# Patient Record
Sex: Male | Born: 1960 | Race: White | Hispanic: No | Marital: Married | Smoking: Never smoker
Health system: Southern US, Community
[De-identification: ages and names within clinical notes are randomized; demographics above are authoritative.]

## PROBLEM LIST (undated history)

## (undated) DIAGNOSIS — F419 Anxiety disorder, unspecified: Secondary | ICD-10-CM

## (undated) DIAGNOSIS — M1990 Unspecified osteoarthritis, unspecified site: Secondary | ICD-10-CM

## (undated) HISTORY — PX: DENTAL SURGERY: SHX609

---

## 2013-02-07 DIAGNOSIS — R7303 Prediabetes: Secondary | ICD-10-CM | POA: Insufficient documentation

## 2013-02-07 DIAGNOSIS — L821 Other seborrheic keratosis: Secondary | ICD-10-CM | POA: Insufficient documentation

## 2013-02-07 DIAGNOSIS — G479 Sleep disorder, unspecified: Secondary | ICD-10-CM | POA: Insufficient documentation

## 2013-02-07 DIAGNOSIS — E785 Hyperlipidemia, unspecified: Secondary | ICD-10-CM | POA: Insufficient documentation

## 2013-02-07 DIAGNOSIS — M25519 Pain in unspecified shoulder: Secondary | ICD-10-CM | POA: Insufficient documentation

## 2013-02-07 DIAGNOSIS — F330 Major depressive disorder, recurrent, mild: Secondary | ICD-10-CM | POA: Insufficient documentation

## 2013-03-08 DIAGNOSIS — Q6602 Congenital talipes equinovarus, left foot: Secondary | ICD-10-CM | POA: Insufficient documentation

## 2013-07-05 DIAGNOSIS — M722 Plantar fascial fibromatosis: Secondary | ICD-10-CM | POA: Insufficient documentation

## 2013-07-05 DIAGNOSIS — F411 Generalized anxiety disorder: Secondary | ICD-10-CM | POA: Insufficient documentation

## 2013-07-05 DIAGNOSIS — M25579 Pain in unspecified ankle and joints of unspecified foot: Secondary | ICD-10-CM | POA: Insufficient documentation

## 2013-12-06 DIAGNOSIS — M19049 Primary osteoarthritis, unspecified hand: Secondary | ICD-10-CM | POA: Insufficient documentation

## 2013-12-06 DIAGNOSIS — M2041 Other hammer toe(s) (acquired), right foot: Secondary | ICD-10-CM | POA: Insufficient documentation

## 2014-04-14 DIAGNOSIS — Z0000 Encounter for general adult medical examination without abnormal findings: Secondary | ICD-10-CM | POA: Insufficient documentation

## 2014-04-14 DIAGNOSIS — Z8042 Family history of malignant neoplasm of prostate: Secondary | ICD-10-CM | POA: Insufficient documentation

## 2015-10-22 DIAGNOSIS — R41840 Attention and concentration deficit: Secondary | ICD-10-CM | POA: Insufficient documentation

## 2016-05-23 DIAGNOSIS — K439 Ventral hernia without obstruction or gangrene: Secondary | ICD-10-CM | POA: Insufficient documentation

## 2017-08-05 DIAGNOSIS — R1031 Right lower quadrant pain: Secondary | ICD-10-CM | POA: Insufficient documentation

## 2018-03-20 ENCOUNTER — Other Ambulatory Visit: Payer: Self-pay

## 2018-03-20 ENCOUNTER — Emergency Department
Admission: EM | Admit: 2018-03-20 | Discharge: 2018-03-20 | Disposition: A | Discharge status: Home / Self Care | Payer: BLUE CROSS/BLUE SHIELD | Attending: Student in an Organized Health Care Education/Training Program | Admitting: Student in an Organized Health Care Education/Training Program

## 2018-03-20 ENCOUNTER — Encounter: Payer: Self-pay | Admitting: Emergency Medicine

## 2018-03-20 ENCOUNTER — Emergency Department: Payer: BLUE CROSS/BLUE SHIELD

## 2018-03-20 DIAGNOSIS — R590 Localized enlarged lymph nodes: Secondary | ICD-10-CM | POA: Insufficient documentation

## 2018-03-20 DIAGNOSIS — J020 Streptococcal pharyngitis: Secondary | ICD-10-CM | POA: Insufficient documentation

## 2018-03-20 DIAGNOSIS — M542 Cervicalgia: Secondary | ICD-10-CM | POA: Diagnosis present

## 2018-03-20 LAB — CBC WITH DIFFERENTIAL/PLATELET
Abs Immature Granulocytes: 0.03 10*3/uL (ref 0.00–0.07)
Basophils Absolute: 0.1 10*3/uL (ref 0.0–0.1)
Basophils Relative: 1 %
Eosinophils Absolute: 0.2 10*3/uL (ref 0.0–0.5)
Eosinophils Relative: 2 %
HCT: 41.6 % (ref 39.0–52.0)
Hemoglobin: 13.7 g/dL (ref 13.0–17.0)
Immature Granulocytes: 0 %
Lymphocytes Relative: 19 %
Lymphs Abs: 1.6 10*3/uL (ref 0.7–4.0)
MCH: 30 pg (ref 26.0–34.0)
MCHC: 32.9 g/dL (ref 30.0–36.0)
MCV: 91.2 fL (ref 80.0–100.0)
MONO ABS: 0.7 10*3/uL (ref 0.1–1.0)
Monocytes Relative: 8 %
Neutro Abs: 5.9 10*3/uL (ref 1.7–7.7)
Neutrophils Relative %: 70 %
Platelets: 315 10*3/uL (ref 150–400)
RBC: 4.56 MIL/uL (ref 4.22–5.81)
RDW: 13.4 % (ref 11.5–15.5)
WBC: 8.4 10*3/uL (ref 4.0–10.5)
nRBC: 0 % (ref 0.0–0.2)

## 2018-03-20 LAB — COMPREHENSIVE METABOLIC PANEL
ALT: 28 U/L (ref 0–44)
AST: 25 U/L (ref 15–41)
Albumin: 4.3 g/dL (ref 3.5–5.0)
Alkaline Phosphatase: 48 U/L (ref 38–126)
Anion gap: 9 (ref 5–15)
BUN: 19 mg/dL (ref 6–20)
CO2: 27 mmol/L (ref 22–32)
CREATININE: 0.9 mg/dL (ref 0.61–1.24)
Calcium: 9.4 mg/dL (ref 8.9–10.3)
Chloride: 100 mmol/L (ref 98–111)
GFR calc Af Amer: >60 mL/min (ref >60)
GFR calc non Af Amer: >60 mL/min (ref >60)
Glucose, Bld: 89 mg/dL (ref 70–99)
Potassium: 4 mmol/L (ref 3.5–5.1)
Sodium: 136 mmol/L (ref 135–145)
Total Bilirubin: 0.3 mg/dL (ref 0.3–1.2)
Total Protein: 8.5 g/dL — ABNORMAL HIGH (ref 6.5–8.1)

## 2018-03-20 LAB — LACTIC ACID, PLASMA: Lactic Acid, Venous: 0.9 mmol/L (ref 0.5–1.9)

## 2018-03-20 LAB — INFLUENZA PANEL BY PCR (TYPE A & B)
Influenza A By PCR: NEGATIVE
Influenza B By PCR: NEGATIVE

## 2018-03-20 LAB — GROUP A STREP BY PCR: Group A Strep by PCR: DETECTED — AB

## 2018-03-20 IMAGING — CT CT NECK W/ CM
4 of 5 series · 15 of 33 positions shown, 17 images · IV contrast (omnipaque)
Comparison: None.

CLINICAL DATA: Solitary neck mass

EXAM:
CT NECK WITH CONTRAST
TECHNIQUE: Multidetector CT imaging of the neck was performed using the
standard protocol following the bolus administration of intravenous
contrast.
CONTRAST:  75mL OMNIPAQUE IOHEXOL 300 MG/ML  SOLN

[Series 2: axial neck · axial · 0.54mm/px · z∈[-333,-185]mm · 4 of 124 slices shown, 5 images]
[im 25/124  soft-tissue]
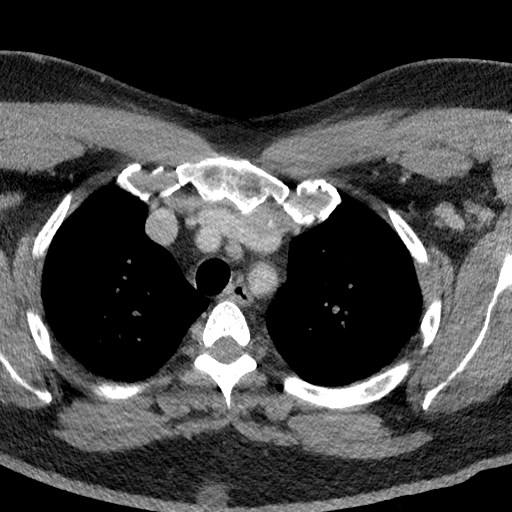
[im 25/124  bone]
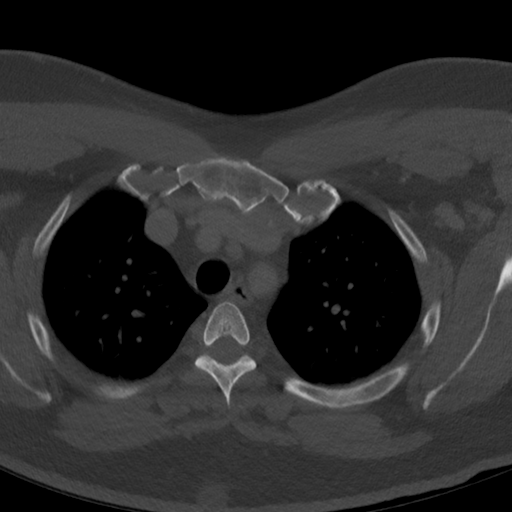
[im 50/124  bone]
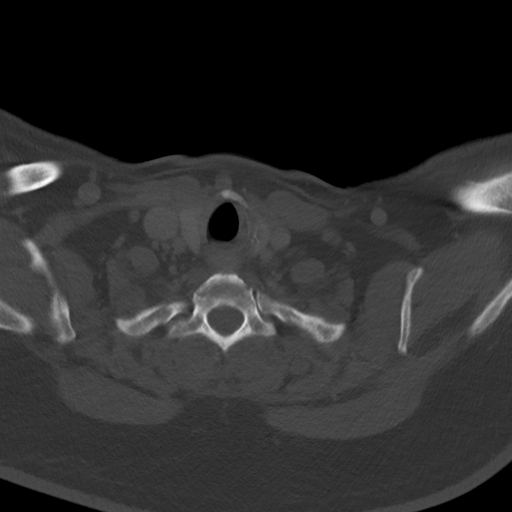
[im 74/124  bone]
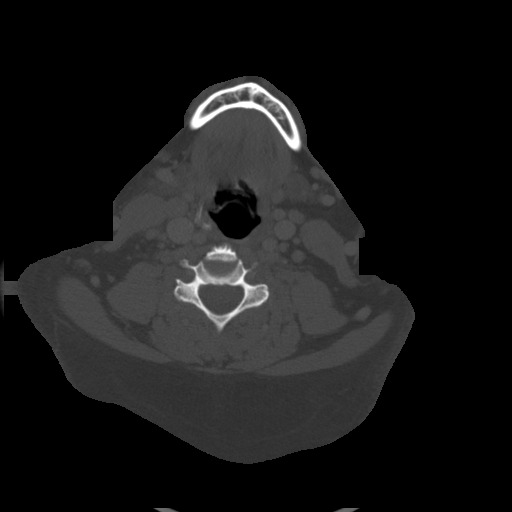
[im 99/124  bone]
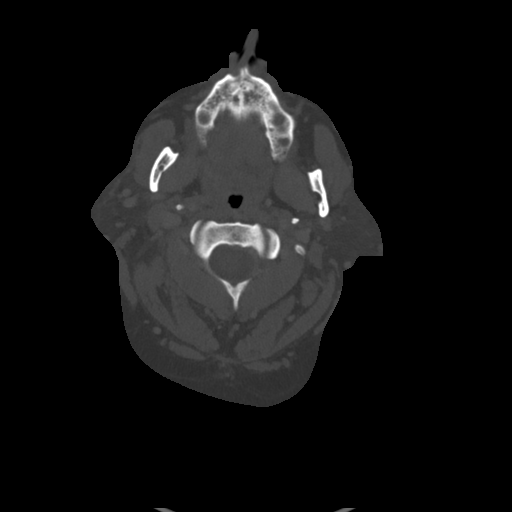

[Series 6: sag neck · sagittal · 0.45mm/px · 5 of 83 slices shown, 6 images]
[im 28/83  bone]
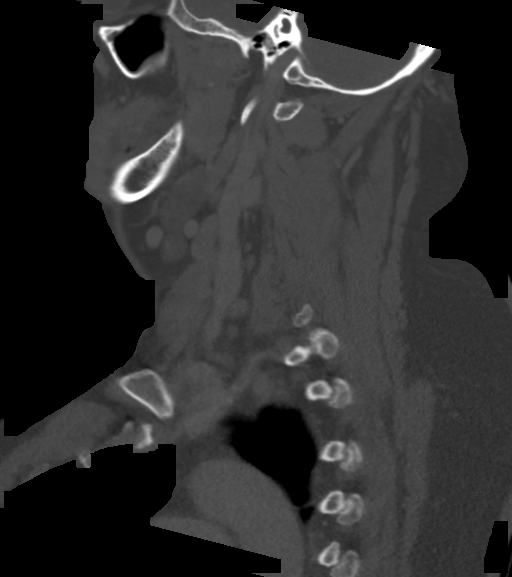
[im 35/83  bone]
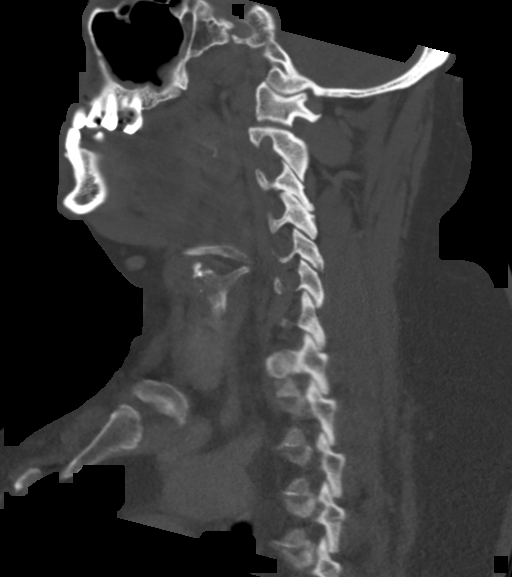
[im 42/83  soft-tissue]
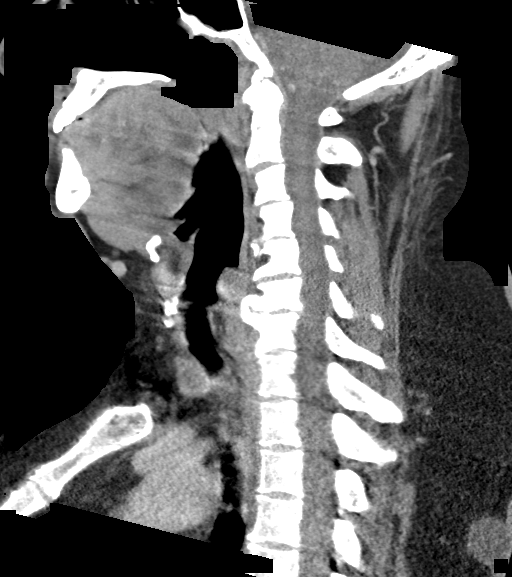
[im 42/83  bone]
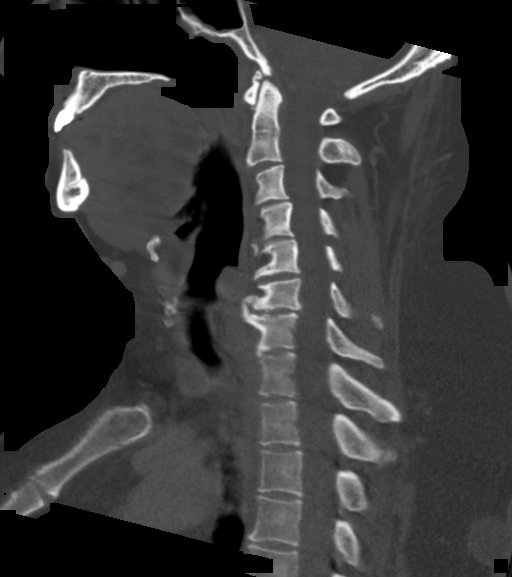
[im 48/83  bone]
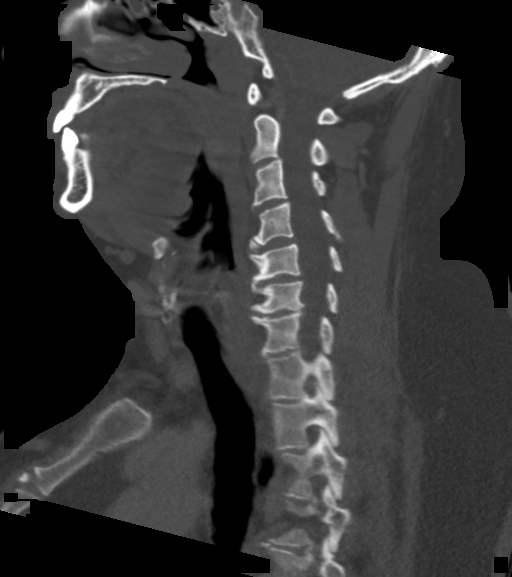
[im 55/83  bone]
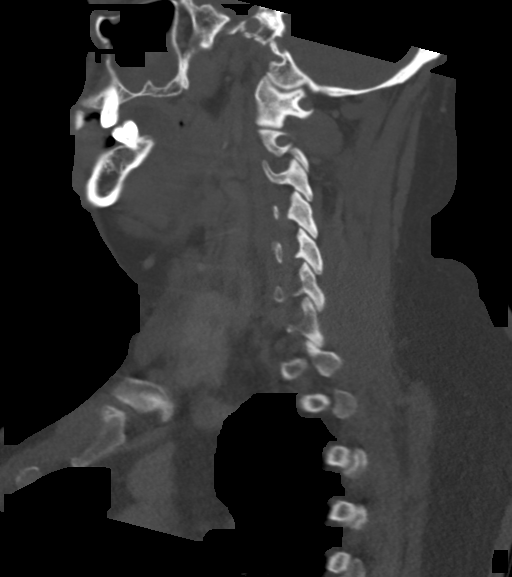

[Series 7: cor neck · coronal · 0.43mm/px · 3 of 102 slices shown]
[im 25/102  bone]
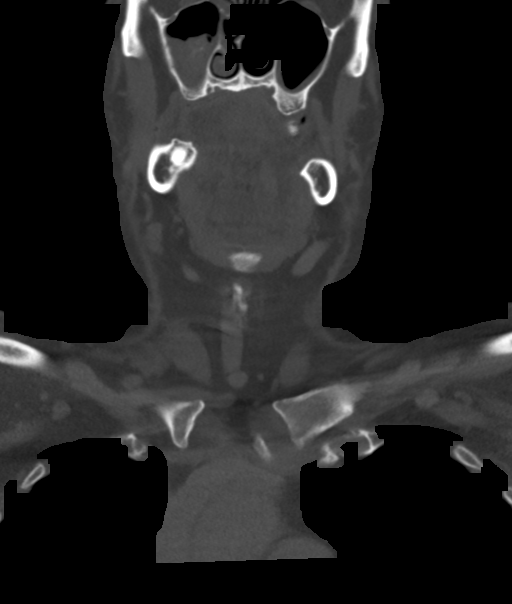
[im 42/102  bone]
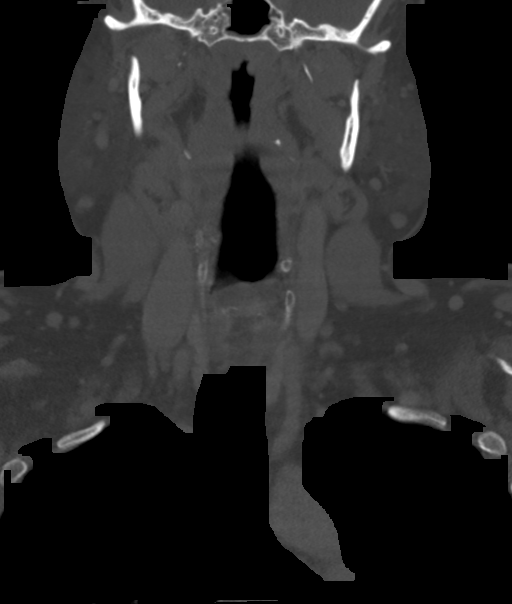
[im 60/102  bone]
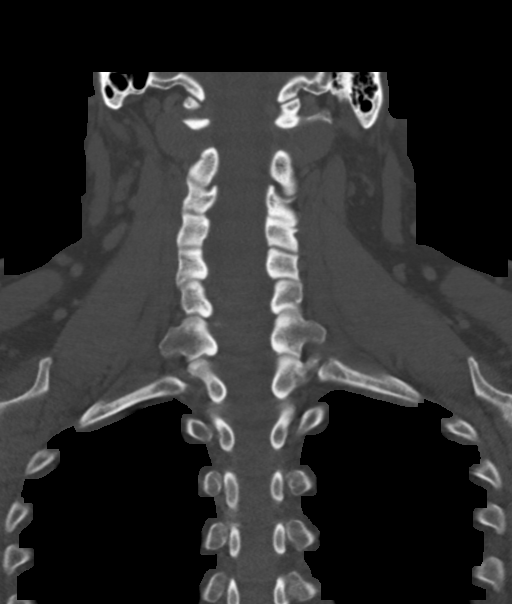

[Series 8: orthogonal ax · axial · 0.40mm/px · z∈[-346,-258]mm · 3 of 115 slices shown]
[im 23/115  bone]
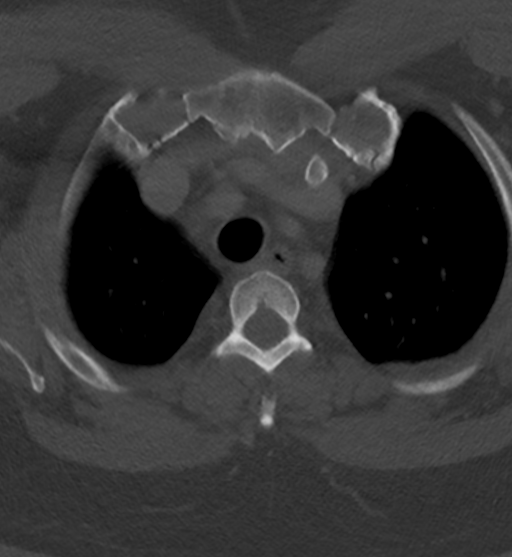
[im 46/115  bone]
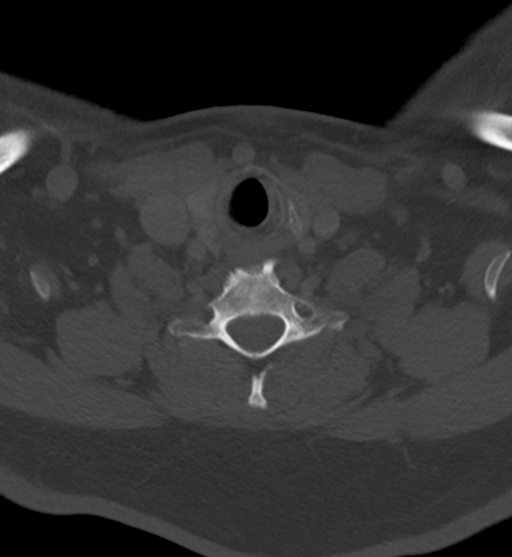
[im 69/115  bone]
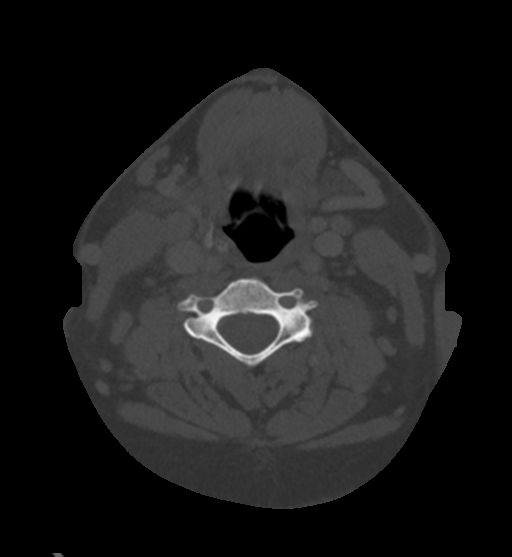

[15 of 33 positions shown; findings below may reference images not displayed]

FINDINGS: PHARYNX AND LARYNX:

--Nasopharynx: Fossae of ALOK are clear. Normal adenoid
tonsils for age.

--Oral cavity and oropharynx: The palatine and lingual tonsils are
normal. The visible oral cavity and floor of mouth are normal.

--Hypopharynx: Normal vallecula and pyriform sinuses.

--Larynx: Normal epiglottis and pre-epiglottic space. Normal
aryepiglottic and vocal folds.

--Retropharyngeal space: No abscess, effusion or lymphadenopathy.

SALIVARY GLANDS:

--Parotid: No mass lesion or inflammation. No sialolithiasis or
ductal dilatation. Multiple benign morphology left parotid lymph
nodes.

--Submandibular: Symmetric without inflammation. No sialolithiasis
or ductal dilatation.

--Sublingual: Normal. No ranula or other visible lesion of the base
of tongue and floor of mouth.

THYROID: Normal.

LYMPH NODES: At right level 2A, there is a 10 mm lymph node with
central hypoattenuation ([DATE]). There are multiple other prominent
right level 2A lymph nodes, including a more inferior node that
measures up to 10 mm. Multiple subcentimeter left cervical lymph
nodes.

VASCULAR: Major cervical vessels are patent.

LIMITED INTRACRANIAL: Normal.

VISUALIZED ORBITS: Normal.

MASTOIDS AND VISUALIZED PARANASAL SINUSES: Partial opacification of
the right maxillary sinus and mild left maxillary mucosal
thickening.

SKELETON: No bony spinal canal stenosis. No lytic or blastic
lesions.

UPPER CHEST: Clear.

OTHER: None.
IMPRESSION: Multiple mildly enlarged right cervical lymph nodes, including a 10
mm centrally necrotic node at the right level 2A. If there are
infectious symptoms, this may be a suppurative reactive lymph node.
However, this also could represent lymphatic spread of an occult
head and neck neoplasm (most typically squamous cell carcinoma).

If there is clinical evidence of infection, follow-up imaging after
treatment is recommended, at a minimum. Otolaryngology referral
should be considered to assess the need for upper aerodigestive
endoscopy.

## 2018-03-20 MED ORDER — AMOXICILLIN 500 MG PO CAPS
1000.0000 mg | ORAL_CAPSULE | Freq: Once | ORAL | Status: AC
  Administered 2018-03-20: 1000 mg via ORAL
  Filled 2018-03-20: qty 2

## 2018-03-20 MED ORDER — AMOXICILLIN 875 MG PO TABS: 875.0000 mg | ORAL_TABLET | Freq: Two times a day (BID) | ORAL | 0 refills | Status: DC

## 2018-03-20 MED ORDER — IOHEXOL 300 MG/ML  SOLN
75.0000 mL | Freq: Once | INTRAMUSCULAR | Status: AC | PRN
  Administered 2018-03-20: 75 mL via INTRAVENOUS
  Filled 2018-03-20: qty 75

## 2018-03-20 MED ORDER — SODIUM CHLORIDE 0.9 % IV BOLUS
1000.0000 mL | Freq: Once | INTRAVENOUS | Status: AC
  Administered 2018-03-20: 1000 mL via INTRAVENOUS

## 2018-03-20 NOTE — ED Notes (Signed)
Patient transported to CT 

## 2018-03-20 NOTE — ED Notes (Signed)
Pt to the er for swelling to the right side of the face at the jaw line. Pt has been sick for several weeks and has been treated for antibiotics. Pt has had sinus drainage but no fever and no sore throat. Wife recently treated for strep. Pt has tried

## 2018-03-20 NOTE — ED Provider Notes (Signed)
[facility] Emergency Department Provider Note  ____________________________________________  Time seen: Approximately 7:04 PM  I have reviewed the triage vital signs and the nursing notes.   HISTORY  Chief Complaint Facial Swelling    HPI Tristan Carpenter is a 58 y.o. male who presents the emergency department complaining of right-sided neck pain, edema.  Patient reports that  over the past 2 weeks he has had URI symptoms with nasal congestion, sore throat, cough.  Approximately a week ago he started to develop right-sided neck pain in linear distribution starting at the angle of the mandible running down his neck.  Patient reports that he will have intermittent edema to the area as well.  He denies any fevers or chills, nasal congestion, sore throat currently.  Patient is concerned as symptoms have worsened and not improved to the side of his neck.  No difficulty breathing or swallowing.  No chest pain, shortness of breath abdominal pain.   History reviewed. No pertinent past medical history.  There are no active problems to display for this patient.   History reviewed. No pertinent surgical history.  Prior to Admission medications   Medication Sig Start Date End Date Taking? Authorizing Provider  amoxicillin (AMOXIL) 875 MG tablet Take 1 tablet (875 mg total) by mouth 2 (two) times daily. 03/20/18   , Delorise Royals, PA-C    Allergies Patient has no known allergies.  History reviewed. No pertinent family history.  Social History Social History   Tobacco Use  . Smoking status: Never Smoker  . Smokeless tobacco: Never Used  Substance Use Topics  . Alcohol use: Yes    Comment: occ  . Drug use: Not on file     Review of Systems  Constitutional: No fever/chills Eyes: No visual changes. No discharge ENT: Previous URI symptoms, resolved.  Complaints of right sided neck pain with accompanying edema Cardiovascular: no chest pain. Respiratory:  no cough. No SOB. Gastrointestinal: No abdominal pain.  No nausea, no vomiting.  Musculoskeletal: Negative for musculoskeletal pain. Skin: Negative for rash, abrasions, lacerations, ecchymosis. Neurological: Negative for headaches, focal weakness or numbness. 10-point ROS otherwise negative.  ____________________________________________   PHYSICAL EXAM:  VITAL SIGNS: ED Triage Vitals  Enc Vitals Group     BP 03/20/18 1828 (!) 127/107     Pulse Rate 03/20/18 1828 78     Resp 03/20/18 1828 18     Temp 03/20/18 1828 98.3 F (36.8 C)     Temp Source 03/20/18 1828 Oral     SpO2 03/20/18 1828 95 %     Weight 03/20/18 1829 218 lb (98.9 kg)     Height 03/20/18 1829 5' 9.5" (1.765 m)     Head Circumference --      Peak Flow --      Pain Score 03/20/18 1828 5     Pain Loc --      Pain Edu? --      Excl. in GC? --      Constitutional: Alert and oriented. Well appearing and in no acute distress. Eyes: Conjunctivae are normal. PERRL. EOMI. Head: Atraumatic. ENT:      Ears: EACs and TMs unremarkable bilaterally.      Nose: No congestion/rhinnorhea.      Mouth/Throat: Mucous membranes are moist.  Oropharynx is nonerythematous and nonedematous.  Uvula is midline.  Tonsils are unremarkable.  No indication of retropharyngeal abscess. Neck: No stridor.  Neck is supple full range of motion.  Patient does have edema visualized to  the right lateral neck starting at the submandibular region running distally.  Area is very tender to palpation.  Firm lesion is appreciated with palpation.  No overlying skin changes.  No ecchymosis. Hematological/Lymphatic/Immunilogical: No cervical lymphadenopathy. Cardiovascular: Normal rate, regular rhythm. Normal S1 and S2.  Good peripheral circulation. Respiratory: Normal respiratory effort without tachypnea or retractions. Lungs CTAB. Good air entry to the bases with no decreased or absent breath sounds. Musculoskeletal: Full range of motion to all  extremities. No gross deformities appreciated. Neurologic:  Normal speech and language. No gross focal neurologic deficits are appreciated.  Skin:  Skin is warm, dry and intact. No rash noted. Psychiatric: Mood and affect are normal. Speech and behavior are normal. Patient exhibits appropriate insight and judgement.   ____________________________________________   LABS (all labs ordered are listed, but only abnormal results are displayed)  Labs Reviewed  GROUP A STREP BY PCR - Abnormal; Notable for the following components:      Result Value   Group A Strep by PCR DETECTED (*)    All other components within normal limits  COMPREHENSIVE METABOLIC PANEL - Abnormal; Notable for the following components:   Total Protein 8.5 (*)    All other components within normal limits  INFLUENZA PANEL BY PCR (TYPE A & B)  CBC WITH DIFFERENTIAL/PLATELET  LACTIC ACID, PLASMA   ____________________________________________  EKG   ____________________________________________  RADIOLOGY I personally viewed and evaluated these images as part of my medical decision making, as well as reviewing the written report by the radiologist.  Ct Soft Tissue Neck W Contrast  Result Date: 03/20/2018 CLINICAL DATA:  Solitary neck mass EXAM: CT NECK WITH CONTRAST TECHNIQUE: Multidetector CT imaging of the neck was performed using the standard protocol following the bolus administration of intravenous contrast. CONTRAST:  75mL OMNIPAQUE IOHEXOL 300 MG/ML  SOLN COMPARISON:  None. FINDINGS: PHARYNX AND LARYNX: --Nasopharynx: Fossae of [place] are clear. Normal adenoid tonsils for age. --Oral cavity and oropharynx: The palatine and lingual tonsils are normal. The visible oral cavity and floor of mouth are normal. --Hypopharynx: Normal vallecula and pyriform sinuses. --Larynx: Normal epiglottis and pre-epiglottic space. Normal aryepiglottic and vocal folds. --Retropharyngeal space: No abscess, effusion or lymphadenopathy.  SALIVARY GLANDS: --Parotid: No mass lesion or inflammation. No sialolithiasis or ductal dilatation. Multiple benign morphology left parotid lymph nodes. --Submandibular: Symmetric without inflammation. No sialolithiasis or ductal dilatation. --Sublingual: Normal. No ranula or other visible lesion of the base of tongue and floor of mouth. THYROID: Normal. LYMPH NODES: At right level 2A, there is a 10 mm lymph node with central hypoattenuation (2:33). There are multiple other prominent right level 2A lymph nodes, including a more inferior node that measures up to 10 mm. Multiple subcentimeter left cervical lymph nodes. VASCULAR: Major cervical vessels are patent. LIMITED INTRACRANIAL: Normal. VISUALIZED ORBITS: Normal. MASTOIDS AND VISUALIZED PARANASAL SINUSES: Partial opacification of the right maxillary sinus and mild left maxillary mucosal thickening. SKELETON: No bony spinal canal stenosis. No lytic or blastic lesions. UPPER CHEST: Clear. OTHER: None. IMPRESSION: Multiple mildly enlarged right cervical lymph nodes, including a 10 mm centrally necrotic node at the right level 2A. If there are infectious symptoms, this may be a suppurative reactive lymph node. However, this also could represent lymphatic spread of an occult head and neck neoplasm (most typically squamous cell carcinoma). If there is clinical evidence of infection, follow-up imaging after treatment is recommended, at a minimum. Otolaryngology referral should be considered to assess the need for upper aerodigestive endoscopy. Electronically Signed  By: Deatra Robinson M.D.   On: 03/20/2018 23:19    ____________________________________________    PROCEDURES  Procedure(s) performed:    Procedures    Medications  amoxicillin (AMOXIL) capsule 1,000 mg (has no administration in time range)  sodium chloride 0.9 % bolus 1,000 mL (0 mLs Intravenous Stopped 03/20/18 2210)  iohexol (OMNIPAQUE) 300 MG/ML solution 75 mL (75 mLs Intravenous  Contrast Given 03/20/18 2230)     ____________________________________________   INITIAL IMPRESSION / ASSESSMENT AND PLAN / ED COURSE  Pertinent labs & imaging results that were available during my care of the patient were reviewed by me and considered in my medical decision making (see chart for details).  Review of the [place] CSRS was performed in accordance of the [facility] prior to dispensing any controlled drugs.      Patient's diagnosis is consistent with strep throat, lymphadenopathy of the right cervical region.  Patient presented to the emergency department with a concern for "mass" to the right submandibular region.  On exam, patient did have a palpable lesion in this area.  Patient has had a history of 2 weeks of URI type symptoms.  Patient was positive for strep via swab.  Given the lesion to the right side of the neck, patient was evaluated with CT scan.  There is an enlarged lymphadenopathy.  While this may be correlated with infectious process, patient is advised to follow-up with ENT for further evaluation to ensure no other more worrisome causes of lymphadenopathy in this region.  Patient verbalizes understanding.  Patient will be placed on antibiotics for strep and follow-up with ENT.. Patient is given ED precautions to return to the ED for any worsening or new symptoms.     ____________________________________________  FINAL CLINICAL IMPRESSION(S) / ED DIAGNOSES  Final diagnoses:  Strep throat  Lymphadenopathy of right cervical region      NEW MEDICATIONS STARTED DURING THIS VISIT:  ED Discharge Orders         Ordered    amoxicillin (AMOXIL) 875 MG tablet  2 times daily     03/20/18 2335              This chart was dictated using voice recognition software/Dragon. Despite best efforts to proofread, errors can occur which can change the meaning. Any change was purely unintentional.    Racheal Patches, PA-C 03/20/18 2337    Willy Eddy,  MD 03/20/18 [phone]

## 2018-03-20 NOTE — ED Triage Notes (Signed)
Pt presents to ED c/o swelling to L side of neck intermittently x1 week. Pt states site is tender to touch, worse at night, no redness noted at this time. Pt speaks with clear voice, no respiratory distress noted.

## 2018-03-20 NOTE — ED Notes (Signed)
Lab called to inquire about CMP results. Machine is down. QC being done.

## 2019-04-10 DIAGNOSIS — M2550 Pain in unspecified joint: Secondary | ICD-10-CM | POA: Insufficient documentation

## 2019-07-12 ENCOUNTER — Ambulatory Visit (INDEPENDENT_AMBULATORY_CARE_PROVIDER_SITE_OTHER): Payer: BC Managed Care – PPO

## 2019-07-12 ENCOUNTER — Other Ambulatory Visit: Payer: Self-pay | Admitting: Podiatry

## 2019-07-12 ENCOUNTER — Ambulatory Visit (INDEPENDENT_AMBULATORY_CARE_PROVIDER_SITE_OTHER): Payer: BC Managed Care – PPO | Admitting: Podiatry

## 2019-07-12 ENCOUNTER — Encounter: Payer: Self-pay | Admitting: Podiatry

## 2019-07-12 ENCOUNTER — Other Ambulatory Visit: Payer: Self-pay

## 2019-07-12 DIAGNOSIS — M779 Enthesopathy, unspecified: Secondary | ICD-10-CM

## 2019-07-12 DIAGNOSIS — M722 Plantar fascial fibromatosis: Secondary | ICD-10-CM

## 2019-07-12 DIAGNOSIS — M19079 Primary osteoarthritis, unspecified ankle and foot: Secondary | ICD-10-CM

## 2019-07-12 DIAGNOSIS — M659 Synovitis and tenosynovitis, unspecified: Secondary | ICD-10-CM | POA: Diagnosis not present

## 2019-07-12 MED ORDER — CELECOXIB 100 MG PO CAPS: 100.0000 mg | ORAL_CAPSULE | Freq: Two times a day (BID) | ORAL | 3 refills | Status: DC

## 2019-07-12 MED ORDER — METHYLPREDNISOLONE 4 MG PO TBPK: ORAL_TABLET | ORAL | 0 refills | Status: DC

## 2019-07-12 NOTE — Progress Notes (Signed)
   HPI: 59 y.o. male presenting today as a new patient for evaluation of bilateral foot and ankle pain.  Patient states that he is been dealing with constant pain to his feet and ankles for several years now.  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  Standing for long periods of time exacerbate his pain.  He is currently taking meloxicam daily from his PCP with minimal relief.  No past medical history on file.   Physical Exam: General: The patient is alert and oriented x3 in no acute distress.  Dermatology: Skin is warm, dry and supple bilateral lower extremities. Negative for open lesions or macerations.  Vascular: Palpable pedal pulses bilaterally. No edema or erythema noted. Capillary refill within normal limits.  Neurological: Epicritic and protective threshold grossly intact bilaterally.   Musculoskeletal Exam: Range of motion within normal limits to all pedal and ankle joints bilateral. Muscle strength 5/5 in all groups bilateral.  There is pain on palpation to the bilateral plantar heels consistent with chronic plantar fasciitis.  Pain on palpation also noted to the anterior medial and lateral aspect of the bilateral ankle joints.  Pain with range of motion also noted.  Radiographic Exam:  Normal osseous mineralization.  Degenerative changes noted to the bilateral foot and ankle joint spaces.  There are calcifications and bone noted along the plantar fascia of the bilateral feet best visualized on lateral view.  Assessment: 1.  Generalized foot pain bilateral 2.  DJD bilateral foot and ankle 3.  Plantar fasciitis bilateral 4.  Ankle synovitis bilateral 5.  History of clubfoot bilateral   Plan of Care:  1. Patient evaluated. X-Rays reviewed.  2.  Patient declined any injections today.  Patient has anxiety of needles 3.  Prescription for Medrol Dosepak 4.   Prescription for Celebrex 100 mg 2 times daily.  Discontinue meloxicam 5.  Continue custom molded orthotics 6.  Return to clinic as needed      Felecia Shelling, DPM [facility]  Dr. Felecia Shelling, DPM    [address] [place], [place] [address]                Office [phone]  Fax [phone]

## 2019-07-15 ENCOUNTER — Ambulatory Visit: Payer: Self-pay | Admitting: Podiatry

## 2019-12-20 ENCOUNTER — Other Ambulatory Visit: Payer: Self-pay | Admitting: Podiatry

## 2019-12-23 NOTE — Telephone Encounter (Signed)
Please advise 

## 2020-01-23 ENCOUNTER — Ambulatory Visit: Payer: BC Managed Care – PPO | Admitting: Podiatry

## 2020-01-30 ENCOUNTER — Ambulatory Visit: Payer: BC Managed Care – PPO | Admitting: Podiatry

## 2020-01-31 ENCOUNTER — Telehealth: Payer: Self-pay

## 2020-01-31 NOTE — Telephone Encounter (Signed)
Per RX benefits, Celebrex 100 mg has been approved from 01/31/2020 to 01/29/2021

## 2020-02-04 ENCOUNTER — Ambulatory Visit: Payer: Self-pay | Admitting: Podiatry

## 2020-03-26 ENCOUNTER — Encounter: Payer: Self-pay | Admitting: Podiatry

## 2020-03-26 ENCOUNTER — Other Ambulatory Visit: Payer: Self-pay

## 2020-03-26 ENCOUNTER — Ambulatory Visit (INDEPENDENT_AMBULATORY_CARE_PROVIDER_SITE_OTHER): Payer: 59 | Admitting: Podiatry

## 2020-03-26 DIAGNOSIS — M7751 Other enthesopathy of right foot: Secondary | ICD-10-CM

## 2020-03-26 DIAGNOSIS — M7752 Other enthesopathy of left foot: Secondary | ICD-10-CM | POA: Diagnosis not present

## 2020-03-26 DIAGNOSIS — M24273 Disorder of ligament, unspecified ankle: Secondary | ICD-10-CM

## 2020-03-26 NOTE — Progress Notes (Signed)
Subjective:  Patient ID: Tristan Carpenter, male    DOB: Mar 29, 1960,  MRN: [id number]  Chief Complaint  Patient presents with  . Foot Pain    "My ankles hurt, especially after work.  I'm a janitor."    60 y.o. male presents with the above complaint.  Patient presents with complaint of bilateral ankle pain has been going on for quite some time.  Patient was treated by Dr. Logan Bores last year but without steroid injection.  He is now more amenable for steroid injection.  He works as a Copy.  He denies any other acute complaints.  He would like to receive steroid injection help decrease some of the pain that he is having.  The insoles that he obtained from last time is functioning well without any acute problems.   Review of Systems: Negative except as noted in the HPI. Denies N/V/F/Ch.  No past medical history on file.  Current Outpatient Medications:  .  atorvastatin (LIPITOR) 20 MG tablet, Take by mouth., Disp: , Rfl:  .  buPROPion (WELLBUTRIN XL) 150 MG 24 hr tablet, Take by mouth., Disp: , Rfl:  .  celecoxib (CELEBREX) 100 MG capsule, TAKE 1 CAPSULE BY MOUTH TWICE A DAY, Disp: 60 capsule, Rfl: 3 .  DULoxetine (CYMBALTA) 20 MG capsule, Take by mouth., Disp: , Rfl:  .  traZODone (DESYREL) 50 MG tablet, Take by mouth., Disp: , Rfl:  .  amoxicillin (AMOXIL) 875 MG tablet, Take 1 tablet (875 mg total) by mouth 2 (two) times daily. (Patient not taking: Reported on 03/26/2020), Disp: 14 tablet, Rfl: 0 .  gabapentin (NEURONTIN) 100 MG capsule, Take by mouth. (Patient not taking: Reported on 03/26/2020), Disp: , Rfl:  .  meloxicam (MOBIC) 15 MG tablet, Take by mouth. (Patient not taking: Reported on 03/26/2020), Disp: , Rfl:  .  methylPREDNISolone (MEDROL DOSEPAK) 4 MG TBPK tablet, 6 day dose pack - take as directed (Patient not taking: Reported on 03/26/2020), Disp: 21 tablet, Rfl: 0  Social History   Tobacco Use  Smoking Status Never Smoker  Smokeless Tobacco Never Used    No Known  Allergies Objective:  There were no vitals filed for this visit. There is no height or weight on file to calculate BMI. Constitutional Well developed. Well nourished.  Vascular Dorsalis pedis pulses palpable bilaterally. Posterior tibial pulses palpable bilaterally. Capillary refill normal to all digits.  No cyanosis or clubbing noted. Pedal hair growth normal.  Neurologic Normal speech. Oriented to person, place, and time. Epicritic sensation to light touch grossly present bilaterally.  Dermatologic Nails well groomed and normal in appearance. No open wounds. No skin lesions.  Orthopedic:  Pain on palpation to bilateral lateral ankle.  Pain with resisted dorsiflexion of the ankle joint.  No pain with plantarflexion inversion of the ankle joint.  Pain on palpation to the ATFL ligament.  No pain at the posterior tibial tendon peroneal tendon, Achilles tendon.   Radiographs: None however reviewed from last time Assessment:   1. Ligamentous laxity of ankle, unspecified laterality   2. Capsulitis of ankle, right   3. Capsulitis of ankle, left    Plan:  Patient was evaluated and treated and all questions answered.  Bilateral lateral ankle capsulitis with underlying possible ligament laxity -I explained the patient the etiology of capsulitis and various treatment options were discussed.  Given the amount of pain that is having, I believe patient will benefit from steroid injection to help decrease acute inflammatory component associated pain.  Patient agrees with plan  like to proceed with a steroid injection.  If there is no relief we will discuss getting an MRI to both feet.  No follow-ups on file.

## 2020-04-23 ENCOUNTER — Encounter: Payer: Self-pay | Admitting: Podiatry

## 2020-04-23 ENCOUNTER — Other Ambulatory Visit: Payer: Self-pay

## 2020-04-23 ENCOUNTER — Ambulatory Visit (INDEPENDENT_AMBULATORY_CARE_PROVIDER_SITE_OTHER): Payer: 59 | Admitting: Podiatry

## 2020-04-23 DIAGNOSIS — M7751 Other enthesopathy of right foot: Secondary | ICD-10-CM | POA: Diagnosis not present

## 2020-04-23 DIAGNOSIS — M7752 Other enthesopathy of left foot: Secondary | ICD-10-CM | POA: Diagnosis not present

## 2020-04-23 NOTE — Progress Notes (Signed)
Subjective:  Patient ID: Tristan Carpenter, male    DOB: 1960-08-25,  MRN: [id number]  Chief Complaint  Patient presents with  . Foot Pain  . Nail Problem    "they are not as bad as before but when I wake up they are sore and painful by the end of the day.  I also want to talk to him about nail fungus"    60 y.o. male presents with the above complaint.  Patient presents with complaint bilateral ankle pain/ankle capsulitis.  Patient states the steroid injection did help some.  They would like to know if they can do another one.  He states is still painful but he denies any other acute complaints.   Review of Systems: Negative except as noted in the HPI. Denies N/V/F/Ch.  No past medical history on file.  Current Outpatient Medications:  .  amoxicillin (AMOXIL) 875 MG tablet, Take 1 tablet (875 mg total) by mouth 2 (two) times daily. (Patient not taking: Reported on 03/26/2020), Disp: 14 tablet, Rfl: 0 .  atorvastatin (LIPITOR) 20 MG tablet, Take by mouth., Disp: , Rfl:  .  buPROPion (WELLBUTRIN XL) 150 MG 24 hr tablet, Take by mouth., Disp: , Rfl:  .  celecoxib (CELEBREX) 100 MG capsule, TAKE 1 CAPSULE BY MOUTH TWICE A DAY, Disp: 60 capsule, Rfl: 3 .  DULoxetine (CYMBALTA) 20 MG capsule, Take by mouth., Disp: , Rfl:  .  gabapentin (NEURONTIN) 100 MG capsule, Take by mouth. (Patient not taking: Reported on 03/26/2020), Disp: , Rfl:  .  meloxicam (MOBIC) 15 MG tablet, Take by mouth. (Patient not taking: Reported on 03/26/2020), Disp: , Rfl:  .  methylPREDNISolone (MEDROL DOSEPAK) 4 MG TBPK tablet, 6 day dose pack - take as directed (Patient not taking: Reported on 03/26/2020), Disp: 21 tablet, Rfl: 0 .  traZODone (DESYREL) 50 MG tablet, Take by mouth., Disp: , Rfl:   Social History   Tobacco Use  Smoking Status Never Smoker  Smokeless Tobacco Never Used    No Known Allergies Objective:  There were no vitals filed for this visit. There is no height or weight on file to calculate  BMI. Constitutional Well developed. Well nourished.  Vascular Dorsalis pedis pulses palpable bilaterally. Posterior tibial pulses palpable bilaterally. Capillary refill normal to all digits.  No cyanosis or clubbing noted. Pedal hair growth normal.  Neurologic Normal speech. Oriented to person, place, and time. Epicritic sensation to light touch grossly present bilaterally.  Dermatologic Nails well groomed and normal in appearance. No open wounds. No skin lesions.  Orthopedic:  Pain on palpation to bilateral lateral ankle.  Pain with resisted dorsiflexion of the ankle joint.  No pain with plantarflexion inversion of the ankle joint.  Pain on palpation to the ATFL ligament.  No pain at the posterior tibial tendon peroneal tendon, Achilles tendon.   Radiographs: None however reviewed from last time Assessment:   1. Capsulitis of ankle, right   2. Capsulitis of ankle, left    Plan:  Patient was evaluated and treated and all questions answered.  Bilateral lateral ankle capsulitis with underlying possible ligament laxity -I explained the patient the etiology of capsulitis and various treatment options were discussed.  Given the amount of pain that is having, I believe patient will benefit from steroid injection to help decrease acute inflammatory component associated pain.  -A steroid injection was performed at bilateral ankle joint using 1% plain Lidocaine and 10 mg of Kenalog. This was well tolerated.  Patient agrees with plan like  to proceed with a steroid injection.  If there is no relief we will discuss getting an MRI to both feet.  No follow-ups on file.

## 2020-07-23 ENCOUNTER — Ambulatory Visit: Payer: 59 | Admitting: Podiatry

## 2020-11-05 ENCOUNTER — Encounter: Payer: Self-pay | Admitting: Podiatry

## 2020-11-24 ENCOUNTER — Other Ambulatory Visit: Payer: Self-pay

## 2020-11-24 ENCOUNTER — Encounter: Payer: Self-pay | Admitting: Podiatry

## 2020-11-24 ENCOUNTER — Ambulatory Visit (INDEPENDENT_AMBULATORY_CARE_PROVIDER_SITE_OTHER): Payer: Managed Care, Other (non HMO) | Admitting: Podiatry

## 2020-11-24 DIAGNOSIS — B351 Tinea unguium: Secondary | ICD-10-CM | POA: Diagnosis not present

## 2020-11-24 DIAGNOSIS — M7752 Other enthesopathy of left foot: Secondary | ICD-10-CM | POA: Diagnosis not present

## 2020-11-24 DIAGNOSIS — M79675 Pain in left toe(s): Secondary | ICD-10-CM

## 2020-11-24 DIAGNOSIS — M79674 Pain in right toe(s): Secondary | ICD-10-CM

## 2020-11-24 DIAGNOSIS — M7751 Other enthesopathy of right foot: Secondary | ICD-10-CM

## 2020-11-24 NOTE — Progress Notes (Signed)
Subjective:  Patient ID: Tristan Carpenter, male    DOB: Jan 25, 1960,  MRN: [id number]  Chief Complaint  Patient presents with   Nail Problem    Nail trim     60 y.o. male presents with the above complaint.  Patient presents with complaint bilateral ankle pain/ankle capsulitis.  Injection helped for last 6 months.  He would like to know if he can do another one as the injections do help.  He is also here for thickened elongated dystrophic toenails x10.  He would like for me to debride them down.  He denies any other acute complaints.   Review of Systems: Negative except as noted in the HPI. Denies N/V/F/Ch.  No past medical history on file.  Current Outpatient Medications:    amoxicillin (AMOXIL) 875 MG tablet, Take 1 tablet (875 mg total) by mouth 2 (two) times daily. (Patient not taking: Reported on 03/26/2020), Disp: 14 tablet, Rfl: 0   atorvastatin (LIPITOR) 20 MG tablet, Take by mouth., Disp: , Rfl:    buPROPion (WELLBUTRIN XL) 150 MG 24 hr tablet, Take by mouth., Disp: , Rfl:    celecoxib (CELEBREX) 100 MG capsule, TAKE 1 CAPSULE BY MOUTH TWICE A DAY, Disp: 60 capsule, Rfl: 3   DULoxetine (CYMBALTA) 20 MG capsule, Take by mouth., Disp: , Rfl:    gabapentin (NEURONTIN) 100 MG capsule, Take by mouth. (Patient not taking: Reported on 03/26/2020), Disp: , Rfl:    meloxicam (MOBIC) 15 MG tablet, Take by mouth. (Patient not taking: Reported on 03/26/2020), Disp: , Rfl:    methylPREDNISolone (MEDROL DOSEPAK) 4 MG TBPK tablet, 6 day dose pack - take as directed (Patient not taking: Reported on 03/26/2020), Disp: 21 tablet, Rfl: 0   traZODone (DESYREL) 50 MG tablet, Take by mouth., Disp: , Rfl:   Social History   Tobacco Use  Smoking Status Never  Smokeless Tobacco Never    No Known Allergies Objective:  There were no vitals filed for this visit. There is no height or weight on file to calculate BMI. Constitutional Well developed. Well nourished.  Vascular Dorsalis pedis pulses  palpable bilaterally. Posterior tibial pulses palpable bilaterally. Capillary refill normal to all digits.  No cyanosis or clubbing noted. Pedal hair growth normal.  Neurologic Normal speech. Oriented to person, place, and time. Epicritic sensation to light touch grossly present bilaterally.  Dermatologic Nail Exam: Pt has thick disfigured discolored nails with subungual debris noted bilateral entire nail hallux through fifth toenails.  Pain on palpation to the nails. No open wounds. No skin lesions.  Orthopedic:  Pain on palpation to bilateral lateral ankle.  Pain with resisted dorsiflexion of the ankle joint.  No pain with plantarflexion inversion of the ankle joint.  Pain on palpation to the ATFL ligament.  No pain at the posterior tibial tendon peroneal tendon, Achilles tendon.   Radiographs: None however reviewed from last time Assessment:   1. Capsulitis of ankle, right   2. Capsulitis of ankle, left   3. Pain due to onychomycosis of toenails of both feet     Plan:  Patient was evaluated and treated and all questions answered.  Bilateral lateral ankle capsulitis with underlying possible ligament laxity -I explained the patient the etiology of capsulitis and various treatment options were discussed.  Given the amount of pain that is having, I believe patient will benefit from steroid injection to help decrease acute inflammatory component associated pain.  -A second steroid injection was performed at bilateral ankle joint using 1% plain Lidocaine and  10 mg of Kenalog. This was well tolerated.  Patient agrees with plan like to proceed with a steroid injection.  If there is no relief we will discuss getting an MRI to both feet.   Onychomycosis with pain  -Nails palliatively debrided as below. -Educated on self-care  Procedure: Nail Debridement Rationale: pain  Type of Debridement: manual, sharp debridement. Instrumentation: Nail nipper, rotary burr. Number of Nails:  10  Procedures and Treatment: Consent by patient was obtained for treatment procedures. The patient understood the discussion of treatment and procedures well. All questions were answered thoroughly reviewed. Debridement of mycotic and hypertrophic toenails, 1 through 5 bilateral and clearing of subungual debris. No ulceration, no infection noted.  Return Visit-Office Procedure: Patient instructed to return to the office for a follow up visit 3 months for continued evaluation and treatment.  Nicholes Rough, DPM    No follow-ups on file.    No follow-ups on file.

## 2020-12-08 ENCOUNTER — Ambulatory Visit: Payer: Managed Care, Other (non HMO) | Admitting: Podiatry

## 2021-02-24 ENCOUNTER — Other Ambulatory Visit: Payer: Self-pay | Admitting: Sports Medicine

## 2021-02-24 DIAGNOSIS — M7541 Impingement syndrome of right shoulder: Secondary | ICD-10-CM

## 2021-02-24 DIAGNOSIS — M67911 Unspecified disorder of synovium and tendon, right shoulder: Secondary | ICD-10-CM

## 2021-02-24 DIAGNOSIS — M19011 Primary osteoarthritis, right shoulder: Secondary | ICD-10-CM

## 2021-02-24 DIAGNOSIS — M7581 Other shoulder lesions, right shoulder: Secondary | ICD-10-CM

## 2021-02-24 DIAGNOSIS — M7551 Bursitis of right shoulder: Secondary | ICD-10-CM

## 2021-02-24 DIAGNOSIS — G8929 Other chronic pain: Secondary | ICD-10-CM

## 2021-03-08 ENCOUNTER — Ambulatory Visit
Admission: RE | Admit: 2021-03-08 | Discharge: 2021-03-08 | Disposition: A | Discharge status: Home / Self Care | Payer: Managed Care, Other (non HMO) | Source: Ambulatory Visit | Attending: Sports Medicine | Admitting: Sports Medicine

## 2021-03-08 DIAGNOSIS — M7581 Other shoulder lesions, right shoulder: Secondary | ICD-10-CM

## 2021-03-08 DIAGNOSIS — M7541 Impingement syndrome of right shoulder: Secondary | ICD-10-CM

## 2021-03-08 DIAGNOSIS — G8929 Other chronic pain: Secondary | ICD-10-CM

## 2021-03-08 DIAGNOSIS — M19011 Primary osteoarthritis, right shoulder: Secondary | ICD-10-CM

## 2021-03-08 DIAGNOSIS — M67911 Unspecified disorder of synovium and tendon, right shoulder: Secondary | ICD-10-CM

## 2021-03-08 DIAGNOSIS — M7551 Bursitis of right shoulder: Secondary | ICD-10-CM

## 2021-03-08 IMAGING — MR MR SHOULDER*R* W/O CM
4 of 5 series · 27 of 40 positions shown · non-contrast
Comparison: None.

CLINICAL DATA: Chronic right shoulder pain.

EXAM:
MRI OF THE RIGHT SHOULDER WITHOUT CONTRAST
TECHNIQUE: Multiplanar, multisequence MR imaging of the shoulder was performed.
No intravenous contrast was administered.

[Series 4: T2 fat-sat · axial · right · 4.0mm · 0.27mm/px · z∈[-49,+56]mm · 8 of 23 slices shown (1 of 3)]
[im 1/23]
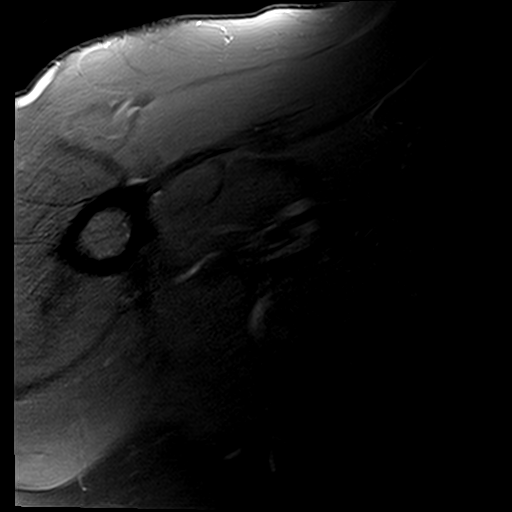
[im 3/23]
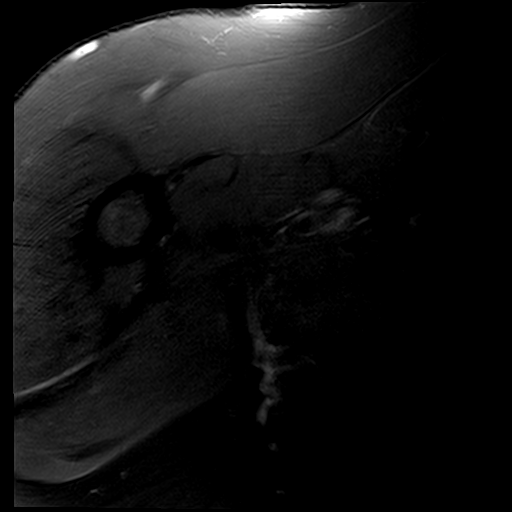
[im 8/23]
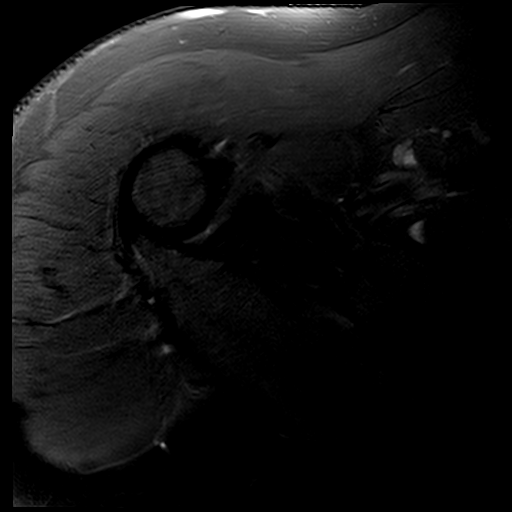
[im 10/23]
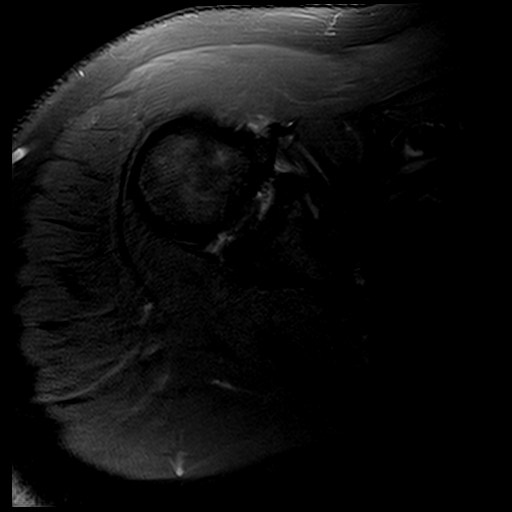
[im 13/23]
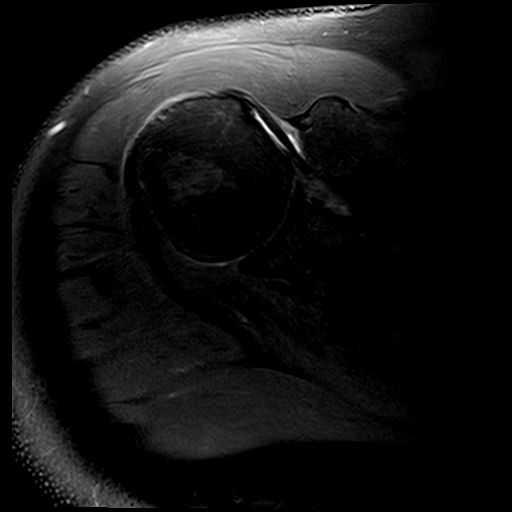
[im 15/23]
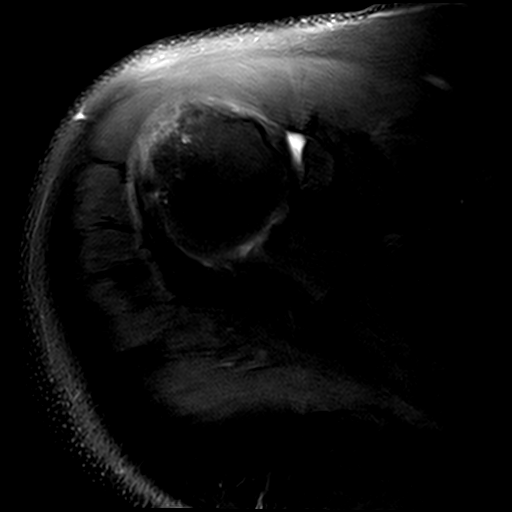
[im 20/23]
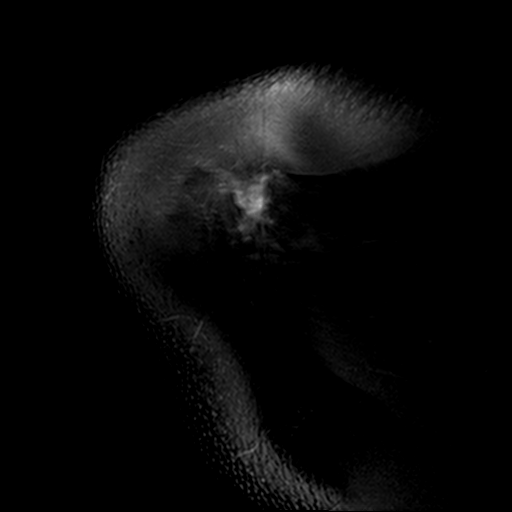
[im 23/23]
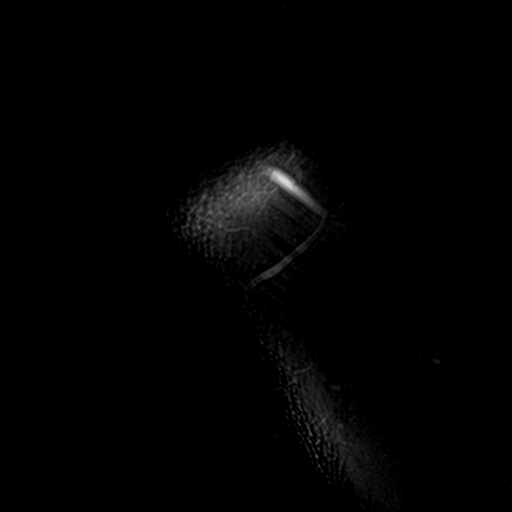

[Series 5: T2 fat-sat · coronal · right · 4.0mm · 0.27mm/px · 7 of 17 slices shown (2 of 3)]
[im 1/17]
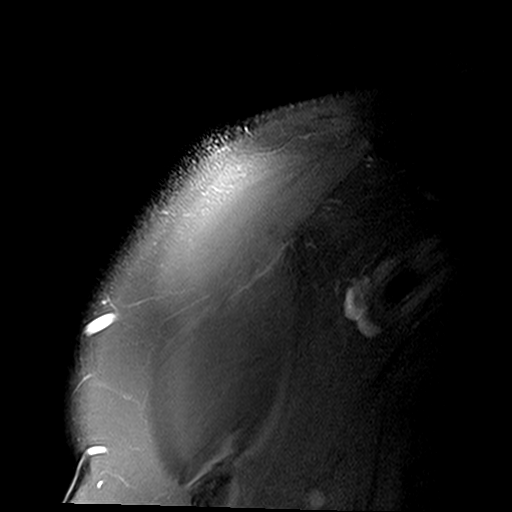
[im 3/17]
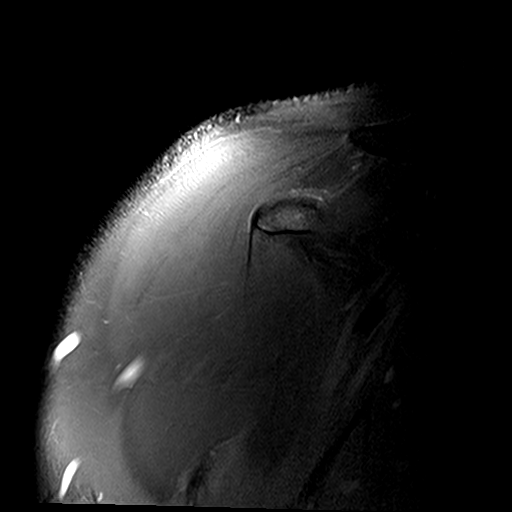
[im 6/17]
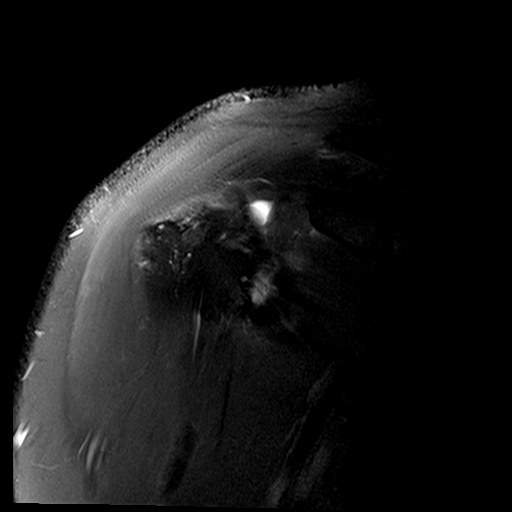
[im 9/17]
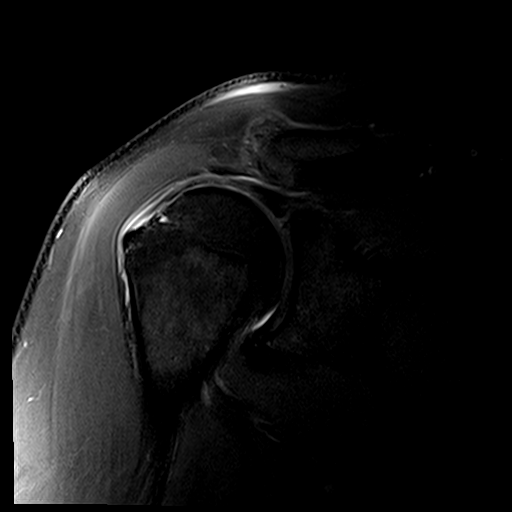
[im 11/17]
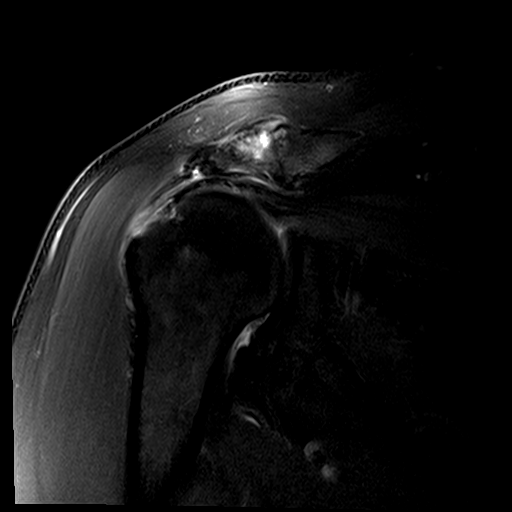
[im 14/17]
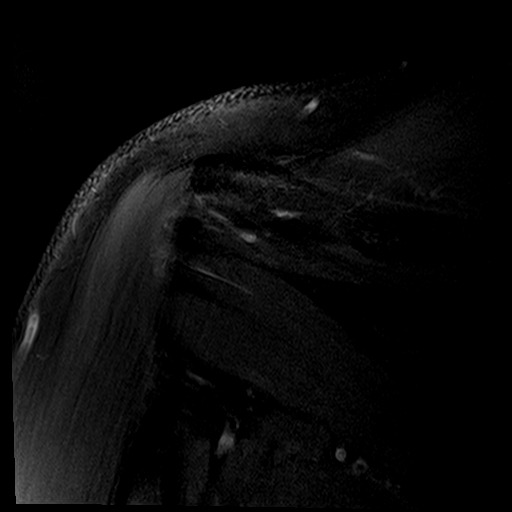
[im 17/17]
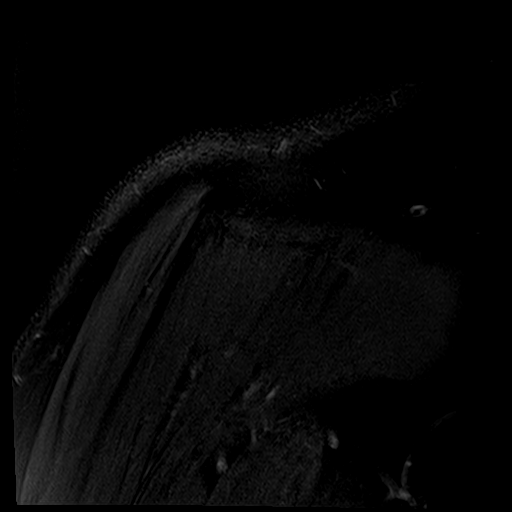

[Series 6: PD · coronal · right · 4.0mm · 0.55mm/px · 7 of 17 slices shown]
[im 1/17]
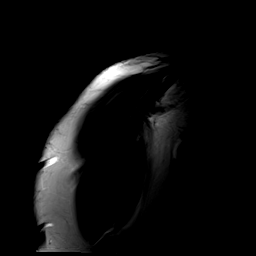
[im 3/17]
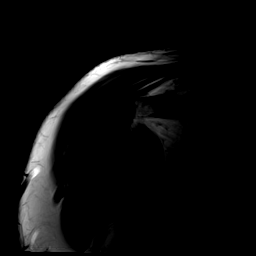
[im 6/17]
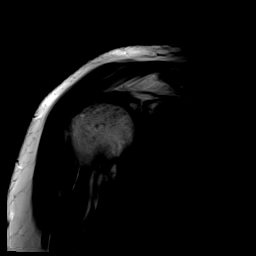
[im 9/17]
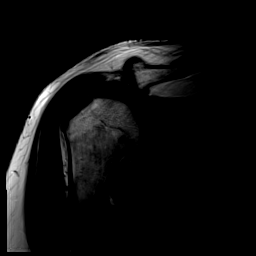
[im 11/17]
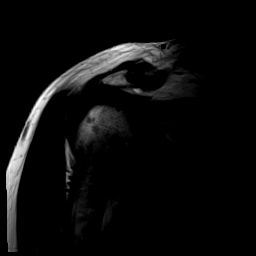
[im 14/17]
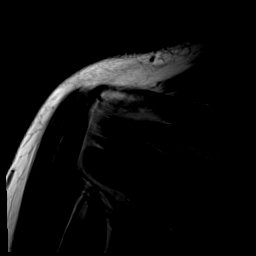
[im 17/17]
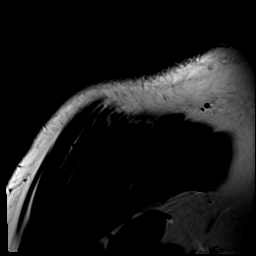

[Series 7: T2 fat-sat · oblique · right · 4.0mm · 0.55mm/px · 5 of 19 slices shown (3 of 3)]
[im 1/19]
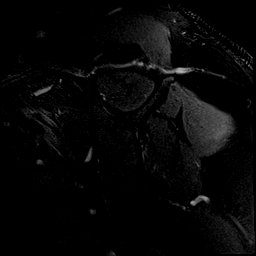
[im 3/19]
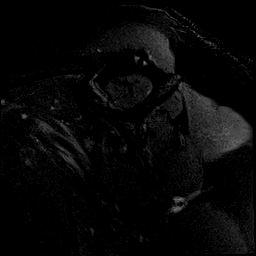
[im 6/19]
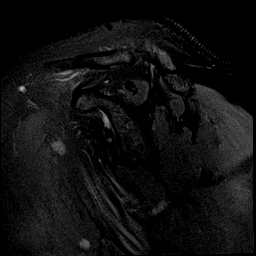
[im 11/19]
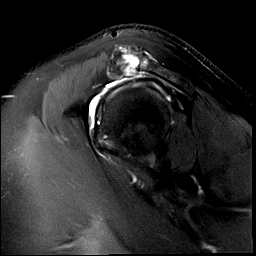
[im 16/19]
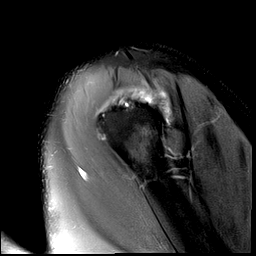

[27 of 40 positions shown; findings below may reference images not displayed]

FINDINGS: Rotator cuff: The humeral head is mildly high-riding. There is
high-grade partial thickness articular side to full-thickness
tearing of the entire AP dimension of the supraspinatus tendon.
There may be portions that are full-thickness at the anterior
supraspinatus tendon insertion. Moderate intermediate T2 signal
infraspinatus tendinosis with likely internal punctate midsubstance
partial-thickness tears. Moderate attenuation of the articular side
of the subscapularis tendon diffusely, a partial-thickness tear
involving the superior 75% of the tendon. The teres minor is intact.

Muscles: Moderate supraspinatus and mild anterior infraspinatus
muscle atrophy.

Biceps long head: There is moderate to high-grade thinning of the
proximal long head of the biceps tendon extending to its entrance
into the bicipital groove, likely partial-thickness tears.

Acromioclavicular Joint: There are moderate to severe degenerative
changes of the acromioclavicular joint including joint space
narrowing, subchondral marrow edema, and peripheral osteophytosis.
Type II acromion.

Glenohumeral Joint: Mild thinning of the glenoid and humeral head
cartilage.

Labrum: Grossly intact, but evaluation is limited by lack of
intraarticular fluid.

Bones:  No acute fracture.

Other: None.
IMPRESSION: :
IMPRESSION: 1. High-grade partial thickness articular side to full-thickness
tearing of the entire AP dimension of the supraspinatus tendon.
Moderate infraspinatus tendinosis with punctate midsubstance
partial-thickness tears.
2. Partial-thickness tears of the articular side of the superior 75%
of the subscapularis tendon.
3. Moderate supraspinatus and mild anterior infraspinatus muscle
atrophy.
4. Moderate to severe degenerative changes of the acromioclavicular
joint.

## 2021-03-11 ENCOUNTER — Other Ambulatory Visit: Payer: Self-pay

## 2021-03-11 ENCOUNTER — Ambulatory Visit (INDEPENDENT_AMBULATORY_CARE_PROVIDER_SITE_OTHER): Payer: Managed Care, Other (non HMO) | Admitting: Podiatry

## 2021-03-11 ENCOUNTER — Encounter: Payer: Self-pay | Admitting: Podiatry

## 2021-03-11 DIAGNOSIS — M7752 Other enthesopathy of left foot: Secondary | ICD-10-CM | POA: Diagnosis not present

## 2021-03-11 DIAGNOSIS — M7751 Other enthesopathy of right foot: Secondary | ICD-10-CM

## 2021-03-11 NOTE — Progress Notes (Signed)
Subjective:  Patient ID: Tristan Carpenter, male    DOB: 1960/12/06,  MRN: [id number]  Chief Complaint  Patient presents with   Injections    "I'm going to get a couple of cortisone shots in my ankles."    61 y.o. male presents with the above complaint.  Patient presents with complaint bilateral ankle pain/ankle capsulitis.  She states the injection helped for few months.  She would like to know if she can get another one.  She states the injection helped she feels better with walking on it.  She denies any other acute complaints  Review of Systems: Negative except as noted in the HPI. Denies N/V/F/Ch.  No past medical history on file.  Current Outpatient Medications:    amoxicillin (AMOXIL) 875 MG tablet, Take 1 tablet (875 mg total) by mouth 2 (two) times daily. (Patient not taking: Reported on 03/26/2020), Disp: 14 tablet, Rfl: 0   atorvastatin (LIPITOR) 20 MG tablet, Take by mouth., Disp: , Rfl:    buPROPion (WELLBUTRIN XL) 150 MG 24 hr tablet, Take by mouth., Disp: , Rfl:    celecoxib (CELEBREX) 100 MG capsule, TAKE 1 CAPSULE BY MOUTH TWICE A DAY, Disp: 60 capsule, Rfl: 3   DULoxetine (CYMBALTA) 20 MG capsule, Take by mouth., Disp: , Rfl:    gabapentin (NEURONTIN) 100 MG capsule, Take by mouth. (Patient not taking: Reported on 03/26/2020), Disp: , Rfl:    meloxicam (MOBIC) 15 MG tablet, Take by mouth. (Patient not taking: Reported on 03/26/2020), Disp: , Rfl:    methylPREDNISolone (MEDROL DOSEPAK) 4 MG TBPK tablet, 6 day dose pack - take as directed (Patient not taking: Reported on 03/26/2020), Disp: 21 tablet, Rfl: 0   traZODone (DESYREL) 50 MG tablet, Take by mouth., Disp: , Rfl:   Social History   Tobacco Use  Smoking Status Never  Smokeless Tobacco Never    No Known Allergies Objective:  There were no vitals filed for this visit. There is no height or weight on file to calculate BMI. Constitutional Well developed. Well nourished.  Vascular Dorsalis pedis pulses palpable  bilaterally. Posterior tibial pulses palpable bilaterally. Capillary refill normal to all digits.  No cyanosis or clubbing noted. Pedal hair growth normal.  Neurologic Normal speech. Oriented to person, place, and time. Epicritic sensation to light touch grossly present bilaterally.  Dermatologic Nail Exam: Pt has thick disfigured discolored nails with subungual debris noted bilateral entire nail hallux through fifth toenails.  Pain on palpation to the nails. No open wounds. No skin lesions.  Orthopedic:  Pain on palpation to bilateral lateral ankle.  Pain with resisted dorsiflexion of the ankle joint.  No pain with plantarflexion inversion of the ankle joint.  Pain on palpation to the ATFL ligament.  No pain at the posterior tibial tendon peroneal tendon, Achilles tendon.   Radiographs: None however reviewed from last time Assessment:   1. Capsulitis of ankle, right   2. Capsulitis of ankle, left      Plan:  Patient was evaluated and treated and all questions answered.  Bilateral lateral ankle capsulitis with underlying possible ligament laxity -I explained the patient the etiology of capsulitis and various treatment options were discussed.  Given the amount of pain that is having, I believe patient will benefit from steroid injection to help decrease acute inflammatory component associated pain.  -Another steroid injection was performed at bilateral ankle joint using 1% plain Lidocaine and 10 mg of Kenalog. This was well tolerated.  Patient agrees with plan like to proceed  with a steroid injection.  If there is no relief we will discuss getting an MRI to both feet.  Nicholes Rough, DPM    No follow-ups on file.    No follow-ups on file.

## 2021-03-15 ENCOUNTER — Encounter: Payer: Self-pay | Admitting: Orthopedic Surgery

## 2021-03-15 ENCOUNTER — Other Ambulatory Visit: Payer: Self-pay | Admitting: Orthopedic Surgery

## 2021-03-16 ENCOUNTER — Other Ambulatory Visit: Payer: Managed Care, Other (non HMO)

## 2021-03-19 ENCOUNTER — Encounter
Admission: RE | Disposition: A | Discharge status: Home / Self Care | Payer: Self-pay | Source: Home / Self Care | Attending: Orthopedic Surgery

## 2021-03-19 ENCOUNTER — Encounter: Payer: Self-pay | Admitting: Orthopedic Surgery

## 2021-03-19 ENCOUNTER — Ambulatory Visit: Payer: Managed Care, Other (non HMO) | Admitting: Anesthesiology

## 2021-03-19 ENCOUNTER — Other Ambulatory Visit: Payer: Self-pay

## 2021-03-19 ENCOUNTER — Ambulatory Visit
Admission: RE | Admit: 2021-03-19 | Discharge: 2021-03-19 | Disposition: A | Discharge status: Home / Self Care | Payer: Managed Care, Other (non HMO) | Attending: Orthopedic Surgery | Admitting: Orthopedic Surgery

## 2021-03-19 DIAGNOSIS — F32A Depression, unspecified: Secondary | ICD-10-CM | POA: Insufficient documentation

## 2021-03-19 DIAGNOSIS — X58XXXA Exposure to other specified factors, initial encounter: Secondary | ICD-10-CM | POA: Insufficient documentation

## 2021-03-19 DIAGNOSIS — F419 Anxiety disorder, unspecified: Secondary | ICD-10-CM | POA: Insufficient documentation

## 2021-03-19 DIAGNOSIS — M75121 Complete rotator cuff tear or rupture of right shoulder, not specified as traumatic: Secondary | ICD-10-CM | POA: Insufficient documentation

## 2021-03-19 DIAGNOSIS — M19011 Primary osteoarthritis, right shoulder: Secondary | ICD-10-CM | POA: Insufficient documentation

## 2021-03-19 DIAGNOSIS — Z6832 Body mass index (BMI) 32.0-32.9, adult: Secondary | ICD-10-CM | POA: Insufficient documentation

## 2021-03-19 DIAGNOSIS — M25811 Other specified joint disorders, right shoulder: Secondary | ICD-10-CM | POA: Insufficient documentation

## 2021-03-19 DIAGNOSIS — S46211A Strain of muscle, fascia and tendon of other parts of biceps, right arm, initial encounter: Secondary | ICD-10-CM | POA: Diagnosis not present

## 2021-03-19 DIAGNOSIS — E669 Obesity, unspecified: Secondary | ICD-10-CM | POA: Insufficient documentation

## 2021-03-19 DIAGNOSIS — E785 Hyperlipidemia, unspecified: Secondary | ICD-10-CM | POA: Insufficient documentation

## 2021-03-19 DIAGNOSIS — M65811 Other synovitis and tenosynovitis, right shoulder: Secondary | ICD-10-CM | POA: Insufficient documentation

## 2021-03-19 HISTORY — DX: Unspecified osteoarthritis, unspecified site: M19.90

## 2021-03-19 HISTORY — DX: Anxiety disorder, unspecified: F41.9

## 2021-03-19 HISTORY — PX: SHOULDER ARTHROSCOPY WITH SUBACROMIAL DECOMPRESSION, ROTATOR CUFF REPAIR AND BICEP TENDON REPAIR: SHX5687

## 2021-03-19 SURGERY — SHOULDER ARTHROSCOPY WITH SUBACROMIAL DECOMPRESSION, ROTATOR CUFF REPAIR AND BICEP TENDON REPAIR
Anesthesia: Regional | Site: Shoulder | Laterality: Right

## 2021-03-19 MED ORDER — ACETAMINOPHEN 325 MG PO TABS: 325.0000 mg | ORAL_TABLET | ORAL | Status: DC | PRN

## 2021-03-19 MED ORDER — LACTATED RINGERS IV SOLN
INTRAVENOUS | Status: DC | PRN
  Administered 2021-03-19: 4 mL

## 2021-03-19 MED ORDER — FENTANYL CITRATE (PF) 100 MCG/2ML IJ SOLN
INTRAMUSCULAR | Status: DC | PRN
  Administered 2021-03-19: 50 ug via INTRAVENOUS

## 2021-03-19 MED ORDER — GLYCOPYRROLATE 0.2 MG/ML IJ SOLN
0.4000 mg | Freq: Once | INTRAMUSCULAR | Status: AC
  Administered 2021-03-19: 0.4 mg via INTRAVENOUS

## 2021-03-19 MED ORDER — OXYCODONE HCL 5 MG PO TABS: 5.0000 mg | ORAL_TABLET | Freq: Once | ORAL | Status: DC | PRN

## 2021-03-19 MED ORDER — FENTANYL CITRATE PF 50 MCG/ML IJ SOSY: 25.0000 ug | PREFILLED_SYRINGE | INTRAMUSCULAR | Status: DC | PRN

## 2021-03-19 MED ORDER — ASPIRIN EC 325 MG PO TBEC: 325.0000 mg | DELAYED_RELEASE_TABLET | Freq: Every day | ORAL | 0 refills | Status: AC

## 2021-03-19 MED ORDER — BUPIVACAINE HCL (PF) 0.5 % IJ SOLN
INTRAMUSCULAR | Status: DC | PRN
  Administered 2021-03-19: 20 mL via PERINEURAL

## 2021-03-19 MED ORDER — LIDOCAINE HCL (CARDIAC) PF 100 MG/5ML IV SOSY
PREFILLED_SYRINGE | INTRAVENOUS | Status: DC | PRN
  Administered 2021-03-19: 40 mg via INTRAVENOUS

## 2021-03-19 MED ORDER — OXYCODONE HCL 5 MG/5ML PO SOLN: 5.0000 mg | Freq: Once | ORAL | Status: DC | PRN

## 2021-03-19 MED ORDER — LACTATED RINGERS IR SOLN
Status: DC | PRN
  Administered 2021-03-19: 29000 mL

## 2021-03-19 MED ORDER — GLYCOPYRROLATE 0.2 MG/ML IJ SOLN: 0.1000 mg | Freq: Once | INTRAMUSCULAR | Status: DC

## 2021-03-19 MED ORDER — ACETAMINOPHEN 500 MG PO TABS: 1000.0000 mg | ORAL_TABLET | Freq: Three times a day (TID) | ORAL | 2 refills | Status: AC

## 2021-03-19 MED ORDER — MIDAZOLAM HCL 5 MG/5ML IJ SOLN
INTRAMUSCULAR | Status: DC | PRN
  Administered 2021-03-19: 1 mg via INTRAVENOUS

## 2021-03-19 MED ORDER — ONDANSETRON HCL 4 MG/2ML IJ SOLN
INTRAMUSCULAR | Status: DC | PRN
  Administered 2021-03-19: 4 mg via INTRAVENOUS

## 2021-03-19 MED ORDER — BUPIVACAINE LIPOSOME 1.3 % IJ SUSP
INTRAMUSCULAR | Status: DC | PRN
  Administered 2021-03-19: 20 mL via PERINEURAL

## 2021-03-19 MED ORDER — CEFAZOLIN SODIUM-DEXTROSE 2-4 GM/100ML-% IV SOLN
2.0000 g | INTRAVENOUS | Status: AC
  Administered 2021-03-19: 2 g via INTRAVENOUS

## 2021-03-19 MED ORDER — PROPOFOL 500 MG/50ML IV EMUL
INTRAVENOUS | Status: DC | PRN
  Administered 2021-03-19: 100 ug/kg/min via INTRAVENOUS

## 2021-03-19 MED ORDER — PROPOFOL 10 MG/ML IV BOLUS
INTRAVENOUS | Status: DC | PRN
  Administered 2021-03-19: 10 mg via INTRAVENOUS
  Administered 2021-03-19: 30 mg via INTRAVENOUS

## 2021-03-19 MED ORDER — ACETAMINOPHEN 160 MG/5ML PO SOLN: 325.0000 mg | ORAL | Status: DC | PRN

## 2021-03-19 MED ORDER — ONDANSETRON 4 MG PO TBDP: 4.0000 mg | ORAL_TABLET | Freq: Three times a day (TID) | ORAL | 0 refills | Status: AC | PRN

## 2021-03-19 MED ORDER — OXYCODONE HCL 5 MG PO TABS: 5.0000 mg | ORAL_TABLET | ORAL | 0 refills | Status: AC | PRN

## 2021-03-19 MED ORDER — LACTATED RINGERS IV SOLN: INTRAVENOUS | Status: DC

## 2021-03-19 SURGICAL SUPPLY — 54 items
ADAPTER IRRIG TUBE 2 SPIKE SOL (ADAPTER) ×4 IMPLANT
ADPR TBG 2 SPK PMP STRL ASCP (ADAPTER) ×2
ANCH SUT 2 SWLK 19.1 CLS EYLT (Anchor) ×2 IMPLANT
ANCH SUT 2X2.3 TAPE (Anchor) ×2 IMPLANT
ANCHOR 2.3 SP SGL 1.2 XBRAID (Anchor) ×2 IMPLANT
ANCHOR SUT 1.8 FBRTK KNTLS 2SU (Anchor) ×1 IMPLANT
ANCHOR SWIVELOCK BIO 4.75X19.1 (Anchor) ×2 IMPLANT
APL PRP STRL LF DISP 70% ISPRP (MISCELLANEOUS) ×1
BLADE SHAVER 4.5X7 STR FR (MISCELLANEOUS) ×2 IMPLANT
BUR BR 5.5 WIDE MOUTH (BURR) ×2 IMPLANT
CANNULA PART THRD DISP 5.75X7 (CANNULA) ×1 IMPLANT
CANNULA PARTIAL THREAD 2X7 (CANNULA) ×2 IMPLANT
CANNULA TWIST IN 8.25X7CM (CANNULA) ×1 IMPLANT
CHLORAPREP W/TINT 26 (MISCELLANEOUS) ×2 IMPLANT
COOLER POLAR GLACIER W/PUMP (MISCELLANEOUS) ×2 IMPLANT
COVER LIGHT HANDLE UNIVERSAL (MISCELLANEOUS) ×4 IMPLANT
DRAPE U-SHAPE 48X52 POLY STRL (PACKS) ×3 IMPLANT
DRSG TEGADERM 4X4.75 (GAUZE/BANDAGES/DRESSINGS) ×6 IMPLANT
ELECT REM PT RETURN 9FT ADLT (ELECTROSURGICAL) ×2
ELECTRODE REM PT RTRN 9FT ADLT (ELECTROSURGICAL) ×1 IMPLANT
GAUZE SPONGE 4X4 12PLY STRL (GAUZE/BANDAGES/DRESSINGS) ×2 IMPLANT
GAUZE XEROFORM 1X8 LF (GAUZE/BANDAGES/DRESSINGS) ×2 IMPLANT
GLOVE SRG 8 PF TXTR STRL LF DI (GLOVE) ×3 IMPLANT
GLOVE SURG ENC MOIS LTX SZ7.5 (GLOVE) ×7 IMPLANT
GLOVE SURG UNDER POLY LF SZ8 (GLOVE) ×4
GOWN STRL REIN 2XL XLG LVL4 (GOWN DISPOSABLE) ×2 IMPLANT
GOWN STRL REUS W/ TWL LRG LVL3 (GOWN DISPOSABLE) ×3 IMPLANT
GOWN STRL REUS W/TWL LRG LVL3 (GOWN DISPOSABLE) ×2
IV LACTATED RINGER IRRG 3000ML (IV SOLUTION) ×24
IV LR IRRIG 3000ML ARTHROMATIC (IV SOLUTION) ×6 IMPLANT
KIT STABILIZATION SHOULDER (MISCELLANEOUS) ×2 IMPLANT
KIT STR SPEAR 1.8 FBRTK DISP (KITS) ×1 IMPLANT
KIT TURNOVER KIT A (KITS) ×2 IMPLANT
MANIFOLD 4PT FOR NEPTUNE1 (MISCELLANEOUS) ×2 IMPLANT
MASK FACE SPIDER DISP (MASK) ×2 IMPLANT
MAT ABSORB  FLUID 56X50 GRAY (MISCELLANEOUS) ×2
MAT ABSORB FLUID 56X50 GRAY (MISCELLANEOUS) ×2 IMPLANT
PACK ARTHROSCOPY SHOULDER (MISCELLANEOUS) ×2 IMPLANT
PAD ABD DERMACEA PRESS 5X9 (GAUZE/BANDAGES/DRESSINGS) ×4 IMPLANT
PAD WRAPON POLAR SHDR XLG (MISCELLANEOUS) ×1 IMPLANT
PASSER SUT FIRSTPASS SELF (INSTRUMENTS) ×1 IMPLANT
SPONGE T-LAP 18X18 ~~LOC~~+RFID (SPONGE) ×1 IMPLANT
SUT ETHILON 3-0 (SUTURE) ×2 IMPLANT
SUT MNCRL 4-0 (SUTURE)
SUT MNCRL 4-0 27XMFL (SUTURE)
SUT VIC AB 0 CT1 36 (SUTURE) ×1 IMPLANT
SUT VIC AB 2-0 CT2 27 (SUTURE) ×1 IMPLANT
SUTURE MNCRL 4-0 27XMF (SUTURE) ×1 IMPLANT
TAPE MICROFOAM 4IN (TAPE) ×2 IMPLANT
TUBING CONNECTING 10 (TUBING) ×1 IMPLANT
TUBING INFLOW SET DBFLO PUMP (TUBING) ×2 IMPLANT
TUBING OUTFLOW SET DBLFO PUMP (TUBING) ×2 IMPLANT
WAND WEREWOLF FLOW 90D (MISCELLANEOUS) ×2 IMPLANT
WRAPON POLAR PAD SHDR XLG (MISCELLANEOUS) ×2

## 2021-03-19 NOTE — Discharge Instructions (Addendum)
Post-Op Instructions - Rotator Cuff Repair  1. Bracing: You will wear a shoulder immobilizer or sling for 6 weeks. Use PolarCare ice device to help with pain and inflammatino postoperatively. Icing is better than heat in the immediate postoperative period.   2. Driving: No driving for 3 weeks post-op. When driving, do not wear the immobilizer. Ideally, we recommend no driving for 6 weeks while sling is in place as one arm will be immobilized.   3. Activity: No active lifting for 2 months. Wrist, hand, and elbow motion only. Avoid lifting the upper arm away from the body except for hygiene. You are permitted to bend and straighten the elbow passively only (no active elbow motion). You may use your hand and wrist for typing, writing, and managing utensils (cutting food). Do not lift more than a coffee cup for 8 weeks.  When sleeping or resting, inclined positions (recliner chair or wedge pillow) and a pillow under the forearm for support may provide better comfort for up to 4 weeks.  Avoid long distance travel for 4 weeks.  Return to normal activities after rotator cuff repair repair normally takes 6 months on average. If rehab goes very well, may be able to do most activities at 4 months, except overhead or contact sports.  4. Physical Therapy: Begins 3-4 days after surgery, and proceed 1 time per week for the first 6 weeks, then 1-2 times per week from weeks 6-20 post-op.  5. Medications:  - You will be provided a prescription for narcotic pain medicine. After surgery, take 1-2 narcotic tablets every 4 hours if needed for severe pain.  - A prescription for anti-nausea medication will be provided in case the narcotic medicine causes nausea - take 1 tablet every 6 hours only if nauseated.   - Take tylenol 1000 mg (2 Extra Strength tablets or 3 regular strength) every 8 hours for pain.  May decrease or stop tylenol 5 days after surgery if you are having minimal pain. - Take ASA 325mg /day x 2 weeks to  help prevent DVTs/PEs (blood clots).  - DO NOT take ANY nonsteroidal anti-inflammatory pain medications (Advil, Motrin, Ibuprofen, Aleve, Naproxen, or Naprosyn). These medicines can inhibit healing of your shoulder repair.    If you are taking prescription medication for anxiety, depression, insomnia, muscle spasm, chronic pain, or for attention deficit disorder, you are advised that you are at a higher risk of adverse effects with use of narcotics post-op, including narcotic addiction/dependence, depressed breathing, death. If you use non-prescribed substances: alcohol, marijuana, cocaine, heroin, methamphetamines, etc., you are at a higher risk of adverse effects with use of narcotics post-op, including narcotic addiction/dependence, depressed breathing, death. You are advised that taking > 50 morphine milligram equivalents (MME) of narcotic pain medication per day results in twice the risk of overdose or death. For your prescription provided: oxycodone 5 mg - taking more than 6 tablets per day would result in > 50 morphine milligram equivalents (MME) of narcotic pain medication. Be advised that we will prescribe narcotics short-term, for acute post-operative pain only - 3 weeks for major operations such as shoulder repair/reconstruction surgeries.     6. Post-Op Appointment:  Your first post-op appointment will be 10-14 days post-op.  7. Work or School: For most, but not all procedures, we advise staying out of work or school for at least 1 to 2 weeks in order to recover from the stress of surgery and to allow time for healing.   If you need a work  or school note this can be provided.   8. Smoking: If you are a smoker, you need to refrain from smoking in the postoperative period. The nicotine in cigarettes will inhibit healing of your shoulder repair and decrease the chance of successful repair. Similarly, nicotine containing products (gum, patches) should be avoided.   Post-operative  Brace: Apply and remove the brace you received as you were instructed to at the time of fitting and as described in detail as the braces instructions for use indicate.  Wear the brace for the period of time prescribed by your physician.  The brace can be cleaned with soap and water and allowed to air dry only.  Should the brace result in increased pain, decreased feeling (numbness/tingling), increased swelling or an overall worsening of your medical condition, please contact your doctor immediately.  If an emergency situation occurs as a result of wearing the brace after normal business hours, please dial 911 and seek immediate medical attention.  Let your doctor know if you have any further questions about the brace issued to you. Refer to the shoulder sling instructions for use if you have any questions regarding the correct fit of your shoulder sling.  [facility] Customer Care for Troubleshooting: [phone]  Video that illustrates how to properly use a shoulder sling: "Instructions for Proper Use of an Orthopaedic Sling" http://bass.com/       Information for Discharge Teaching: EXPAREL (bupivacaine liposome injectable suspension)   Your surgeon or anesthesiologist gave you EXPAREL(bupivacaine) to help control your pain after surgery.  EXPAREL is a local anesthetic that provides pain relief by numbing the tissue around the surgical site. EXPAREL is designed to release pain medication over time and can control pain for up to 72 hours. Depending on how you respond to EXPAREL, you may require less pain medication during your recovery.  Possible side effects: Temporary loss of sensation or ability to move in the area where bupivacaine was injected. Nausea, vomiting, constipation Rarely, numbness and tingling in your mouth or lips, lightheadedness, or anxiety may occur. Call your doctor right away if you think you may be experiencing any of these sensations, or if you  have other questions regarding possible side effects.  Follow all other discharge instructions given to you by your surgeon or nurse. Eat a healthy diet and drink plenty of water or other fluids.  If you return to the hospital for any reason within 96 hours following the administration of EXPAREL, it is important for health care providers to know that you have received this anesthetic. A teal colored band has been placed on your arm with the date, time and amount of EXPAREL you have received in order to alert and inform your health care providers. Please leave this armband in place for the full 96 hours following administration, and then you may remove the band.  PERIPHERAL NERVE BLOCK PATIENT INFORMATION  Your surgeon has requested a peripheral nerve block for your surgery. This anesthetic technique provides excellent post-operative pain relief for you in a safe and effective manner. It will also help reduce the risk of nausea and vomiting and allow earlier discharge from the hospital.   The block is performed under sedation with ultrasound guidance prior to your procedure. Due to the sedation, your may or may not remember the block experience. The nerve block will begin to take effect anywhere from 5 to 30 minutes after being administered. You will be transported to the operating room from your surgery after the block is  completed.   At the end of surgery, when the anesthesia wears off, you will notice a few things. Your may not be able to move or feel the part of your body targeted by the nerve block. These are normal experiences, and they will disappear as the block wears off.  If you had an interscalene nerve block performed (which is common for shoulder surgery), your voice can be very hoarse and you may feel that you are not able to take as deep a breath as you did before surgery. Some patients may also notice a droopy eyelid on the affected side. These symptoms will resolve once the block wears  off.  Pain control: The nerve block technique used is a single injection that can last anywhere from 1-3 days. The duration of the numbness can vary between individuals. After leaving the hospital, it is important that you begin to take your prescribed pain medication when you start to sense the nerve block wearing off. This will help you avoid unpleasant pain at the time the nerve block wears off, which can sometimes be in the middle of the night. The block will only cover pain in the areas targeted by the nerve block so if you experience surgical pain outside of that area, please take your prescribed pain medication. Management of the numb area: After a nerve block, you cannot feel pain, pressure, or temperature in the affected area so there is an increased risk for injury. You should take extra care to protect the affected areas until sensation and movement returns. Please take caution to not come in contact with extremely hot or cold items because you will not be able to sense or protect yourself form the extremes of temperature.  You may experience some persistent numbness after the procedure by most neurological deficits resolve over time and the incidence of serious long term neurological complications attributable to peripheral nerve blocks are relatively uncommon.

## 2021-03-19 NOTE — Anesthesia Postprocedure Evaluation (Signed)
Anesthesia Post Note ? ?Patient: Tristan Carpenter ? ?Procedure(s) Performed: Right shoulder arthroscopic subscapularis repair, supra spinatus repaiir, distal clavicle excision, and subacromial decompression (Right: Shoulder) ? ? ?  ?Patient location during evaluation: PACU ?Anesthesia Type: Regional and General ?Level of consciousness: awake ?Pain management: pain level controlled ?Vital Signs Assessment: post-procedure vital signs reviewed and stable ?Respiratory status: respiratory function stable ?Cardiovascular status: stable ?Postop Assessment: no signs of nausea or vomiting ?Anesthetic complications: no ? ? ?No notable events documented. ? ?Jola Babinski ? ? ? ? ? ?

## 2021-03-19 NOTE — H&P (Signed)
Paper H&P to be scanned into permanent record. H&P reviewed. No significant changes noted.  

## 2021-03-19 NOTE — Anesthesia Preprocedure Evaluation (Addendum)
Anesthesia Evaluation  ?Patient identified by MRN, date of birth, ID band ?Patient awake ? ? ? ?Reviewed: ?Allergy & Precautions, NPO status  ? ?Airway ?Mallampati: II ? ?TM Distance: >3 FB ? ? ? ? Dental ?  ?Pulmonary ? ?  ?Pulmonary exam normal ? ? ? ? ? ? ? Cardiovascular ? ?Rhythm:Regular Rate:Normal ? ?HLD ?  ?Neuro/Psych ?PSYCHIATRIC DISORDERS Anxiety Depression Numbness of right thumb and first two fingers ?  ? GI/Hepatic ?negative GI ROS,   ?Endo/Other  ?Obesity - BMI > 30 ? Renal/GU ?  ? ?  ?Musculoskeletal ? ?(+) Arthritis ,  ? Abdominal ?  ?Peds ? Hematology ?  ?Anesthesia Other Findings ? ? Reproductive/Obstetrics ? ?  ? ? ? ? ? ? ? ? ? ? ? ? ? ?  ?  ? ? ? ? ? ? ? ?Anesthesia Physical ?Anesthesia Plan ? ?ASA: 2 ? ?Anesthesia Plan: General and Regional  ? ?Post-op Pain Management: Regional block  ? ?Induction: Intravenous ? ?PONV Risk Score and Plan: 2 and Propofol infusion, TIVA, Treatment may vary due to age or medical condition, Ondansetron and Midazolam ? ?Airway Management Planned: Natural Airway and Nasal Cannula ? ?Additional Equipment:  ? ?Intra-op Plan:  ? ?Post-operative Plan:  ? ?Informed Consent: I have reviewed the patients History and Physical, chart, labs and discussed the procedure including the risks, benefits and alternatives for the proposed anesthesia with the patient or authorized representative who has indicated his/her understanding and acceptance.  ? ? ? ? ? ?Plan Discussed with: CRNA ? ?Anesthesia Plan Comments:   ? ? ? ? ? ? ?Anesthesia Quick Evaluation ? ?

## 2021-03-19 NOTE — Transfer of Care (Signed)
Immediate Anesthesia Transfer of Care Note ? ?Patient: Tristan Carpenter ? ?Procedure(s) Performed: Right shoulder arthroscopic subscapularis repair, supra spinatus repaiir, distal clavicle excision, and subacromial decompression (Right: Shoulder) ? ?Patient Location: PACU ? ?Anesthesia Type: General, Regional ? ?Level of Consciousness: awake, alert  and patient cooperative ? ?Airway and Oxygen Therapy: Patient Spontanous Breathing and Patient connected to supplemental oxygen ? ?Post-op Assessment: Post-op Vital signs reviewed, Patient's Cardiovascular Status Stable, Respiratory Function Stable, Patent Airway and No signs of Nausea or vomiting ? ?Post-op Vital Signs: Reviewed and stable ? ?Complications: No notable events documented. ? ?

## 2021-03-19 NOTE — Anesthesia Procedure Notes (Signed)
Anesthesia Regional Block: Interscalene brachial plexus block  ? ?Pre-Anesthetic Checklist: , timeout performed,  Correct Patient, Correct Site, Correct Laterality,  Correct Procedure, Correct Position, site marked,  Risks and benefits discussed,  Surgical consent,  Pre-op evaluation,  At surgeon's request and post-op pain management ? ?Laterality: Right ? ?Prep: chloraprep     ?  ?Needles:  ?Injection technique: Single-shot ? ?Needle Type: Stimiplex   ? ? ?Needle Length: 10cm  ?Needle Gauge: 21  ? ? ? ?Additional Needles: ? ? ?Procedures:,,,, ultrasound used (permanent image in chart),,    ?Narrative:  ?Start time: 03/19/2021 7:01 AM ?End time: 03/19/2021 7:07 AM ?Injection made incrementally with aspirations every 5 mL. ? ?Performed by: Personally  ?Anesthesiologist: Jola Babinski, MD ? ?Additional Notes: ?Functioning IV was confirmed and monitors applied. Ultrasound guidance: relevant anatomy identified, needle position confirmed, local anesthetic spread visualized around nerve(s)., vascular puncture avoided.  Image printed for medical record.  Negative aspiration and no paresthesias; incremental administration of local anesthetic. The patient tolerated the procedure well. Vitals signs recorded in RN notes. ? ? ? ?

## 2021-03-19 NOTE — Op Note (Signed)
SURGERY DATE: 03/19/2021   PRE-OP DIAGNOSIS:  1. Right subacromial impingement 2. Right proximal biceps tear 3. Right rotator cuff tear (supraspinatus and subscapularis) 4. Right acromioclavicular joint arthritis   POST-OP DIAGNOSIS: 1. Right subacromial impingement 2. Right proximal biceps tear 3. Right rotator cuff tear (supraspinatus and subscapularis) 4. Right acromioclavicular joint arthritis   PROCEDURES:  1. Right arthroscopic rotator cuff repair (subscapularis and supraspinatus) 2. Right arthroscopic distal clavicle excision 3. Right arthroscopic subacromial decompression 4. Right arthroscopic extensive debridement of shoulder (glenohumeral and subacromial spaces)   SURGEON: Rosealee Albee, MD   ASSISTANT: None   ANESTHESIA: Gen with Exparel interscalene block   ESTIMATED BLOOD LOSS: 5cc   DRAINS:  none   TOTAL IV FLUIDS: per anesthesia      SPECIMENS: none   IMPLANTS:  - Arthrex knotless fibertak x1 (subscapularis repair) - Arthrex 4.30mm SwiveLock x 2 - Iconix SPEED double loaded with 1.2 and 2.34mm tape x 2     OPERATIVE FINDINGS:  Examination under anesthesia: A careful examination under anesthesia was performed.  Passive range of motion was: FF: 150; ER at side: 80; ER in abduction: 105; IR in abduction: 40.  Anterior load shift: NT.  Posterior load shift: NT.  Sulcus in neutral: NT.  Sulcus in ER: NT.     Intra-operative findings: A thorough arthroscopic examination of the shoulder was performed.  The findings are: 1. Biceps tendon: Not visualized within the glenohumeral joint 2. Superior labrum: Mild erythema and fraying with remnant of biceps anchor complex 3. Posterior labrum and capsule: normal 4. Inferior capsule and inferior recess: normal 5. Glenoid cartilage surface: Normal 6. Supraspinatus attachment: full-thickness tear of the supraspinatus 7. Posterior rotator cuff attachment: normal 8. Humeral head articular cartilage: normal 9. Rotator  interval: significant synovitis 10: Subscapularis tendon: Partial-thickness tear of the superior fibers 11. Anterior labrum: Mildly degenerative 12. IGHL: normal   OPERATIVE REPORT:    Indications for procedure:  Tristan Carpenter is a 61 y.o. male with approximately 2 years of right shoulder pain that has significantly worsened over the past 6 months.  He has had difficulty with overhead motion and reaching away from the body with sensations of weakness.  Symptoms are affecting his ability to work and perform activities of daily living.  Clinical exam and MRI were suggestive of rotator cuff tear involving the supraspinatus and subscapularis, AC joint arthritis, proximal biceps rupture, and subacromial impingement. After discussion of risks, benefits, and alternatives to surgery, the patient elected to proceed.    Procedure in detail:   I identified Tristan Carpenter in the pre-operative holding area.  I marked the operative shoulder with my initials. I reviewed the risks and benefits of the proposed surgical intervention, and the patient wished to proceed.  Anesthesia was then performed with an Exparel interscalene block.  The patient was transferred to the operative suite and placed in the beach chair position.     Appropriate IV antibiotics were administered prior to incision. The operative upper extremity was then prepped and draped in standard fashion. A time out was performed confirming the correct extremity, correct patient, and correct procedure.    I then created a standard posterior portal with an 11 blade. The glenohumeral joint was easily entered with a blunt trocar and the arthroscope introduced. The findings of diagnostic arthroscopy are described above. I debrided degenerative tissue including the synovitic tissue about the rotator interval and anterior and superior labrum.  The biceps anchor complex at the superior labrum was also  debrided with an oscillating shaver.  I then coagulated  the inflamed synovium to obtain hemostasis and reduce the risk of post-operative swelling using an Arthrocare radiofrequency device.   Next, arthroscopic repair of the subscapularis was performed. The lesser tuberosity footprint was prepared with a combination of electrocautery and an arthroscopic curette.  An Arthrex knotless Fibertak was placed into the lesser tuberosity footprint from the anterior portal.  A BirdBeak was used to shuttle the repair suture through the upper border of the subscapularis tendon.  The suture was then shuttled through the anchor. With the arm in neutral rotation, the repair was tensioned appropriately. This appropriately reduced the subscapularis tear.  The arm was then internally and externally rotated and the subscapularis was noted to move appropriately with rotation.  The remainder of the suture was then cut.   Next, the arthroscope was then introduced into the subacromial space. A direct lateral portal was created with an 11-blade after spinal needle localization. An extensive subacromial bursectomy was performed using a combination of the shaver and Arthrocare wand. The entire acromial undersurface was exposed and the CA ligament was subperiosteally elevated to expose the anterior acromial hook. A burr was used to create a flat anterior and lateral aspect of the acromion, converting it from a Type 2 to a Type 1 acromion. Care was made to keep the deltoid fascia intact.   I then turned my attention to the arthroscopic distal clavicle excision. I identified the acromioclavicular joint. Surrounding bursal tissue was debrided and the edges of the joint were identified. I used the 5.27mm barrel burr to remove the distal clavicle parallel to the edge of the acromion. I was able to fit two widths of the burr into the space between the distal clavicle and acromion, signifying that I had removed ~83mm of distal clavicle. This was confirmed by viewing anteriorly and introducing a probe  with measuring marks from the lateral portal. Hemostasis was achieved with an Arthrocare wand.  Next I created an accessory posterolateral portal to assist with visualization and instrumentation.  I debrided the poor quality edges of the supraspinatus tendon.  This was an L-shaped tear of the supraspinatus with the long limb anterior.  I prepared the footprint using a burr to expose bleeding bone.    I then percutaneously placed 1 Iconix SPEED medial row anchor along the anterior portion of the tear at the articular margin. Another SPEED anchor was placed along the posterior portion of the tear at the articular margin. I then shuttled all 8 strands of tape through the rotator cuff just lateral to the musculotendinous junction using a FirstPass suture passer spanning the anterior to posterior extent of the tear. The posterior strands of each suture were passed through an [employer] anchor.  This was placed approximately 2 cm distal to the lateral edge of the footprint in line with the anterior aspect of the tear with appropriate tensioning of each suture prior to final fixation.  Similarly, the anterior strands of each suture were passed through another SwiveLock anchor along the posterior margin of the tear.  There were 2 small dogears, one anteriorly and 1 centrally.  The knotless mechanism of the SwiveLock anchors were utilized to reduce the dogears.  This construct allowed for excellent reapproximation of the rotator cuff to its native footprint without undue tension.  Appropriate compression was achieved.  The repair was stable to external and internal rotation.   Fluid was evacuated from the shoulder, and the portals were closed with  3-0 Nylon. Xeroform was applied to the portals. A sterile dressing was applied, followed by a Polar Care sleeve and a SlingShot shoulder immobilizer/sling. The patient was awakened from anesthesia without difficulty and was transferred to the PACU in stable condition.    Additionally, this case had increased complexity compared to standard arthroscopic rotator cuff repair given the involvement of the subscapularis tear. Repair of this tear increased surgical time by 20 minutes and increased complexity due to additional preparation and repair of subscapularis tear and use of additional implants.   COMPLICATIONS: none   DISPOSITION: plan for discharge home after recovery in PACU     POSTOPERATIVE PLAN: Remain in sling (except hygiene and elbow/wrist/hand RoM exercises as instructed by PT) x 6 weeks and NWB for this time. PT to begin 3-4 days after surgery.  Large rotator cuff repair rehab protocol with subscapularis restrictions. ASA 325mg  daily x 2 weeks for DVT ppx.

## 2021-03-19 NOTE — Progress Notes (Signed)
Assisted Elsje Harker, ANMD  with right, ultrasound guided, interscalene  block. Side rails up, monitors on throughout procedure. See vital signs in flow sheet. Tolerated Procedure well.  

## 2021-06-10 ENCOUNTER — Other Ambulatory Visit: Payer: Self-pay | Admitting: Orthopedic Surgery

## 2021-06-10 DIAGNOSIS — M75121 Complete rotator cuff tear or rupture of right shoulder, not specified as traumatic: Secondary | ICD-10-CM

## 2021-06-13 ENCOUNTER — Ambulatory Visit
Admission: RE | Admit: 2021-06-13 | Discharge: 2021-06-13 | Disposition: A | Discharge status: Home / Self Care | Payer: Managed Care, Other (non HMO) | Source: Ambulatory Visit | Attending: Orthopedic Surgery | Admitting: Orthopedic Surgery

## 2021-06-13 DIAGNOSIS — M75121 Complete rotator cuff tear or rupture of right shoulder, not specified as traumatic: Secondary | ICD-10-CM | POA: Insufficient documentation

## 2021-06-13 IMAGING — MR MR SHOULDER*R* W/O CM
4 of 5 series · 30 of 40 positions shown · non-contrast
Comparison: MRI right shoulder [DATE]

CLINICAL DATA: Continued right shoulder pain. Postoperative
[DATE]. Pain on abduction.

EXAM:
MRI OF THE RIGHT SHOULDER WITHOUT CONTRAST
TECHNIQUE: Multiplanar, multisequence MR imaging of the shoulder was performed.
No intravenous contrast was administered.

[Series 5: T2 fat-sat · axial · right · 4.0mm · 0.44mm/px · z∈[-66,+46]mm · 8 of 26 slices shown (1 of 3)]
[im 1/26]
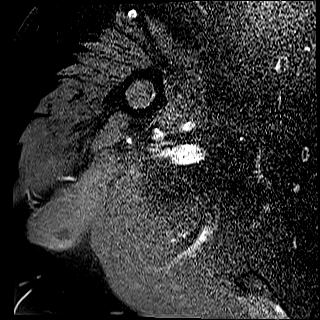
[im 4/26]
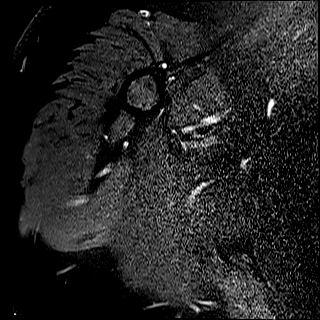
[im 8/26]
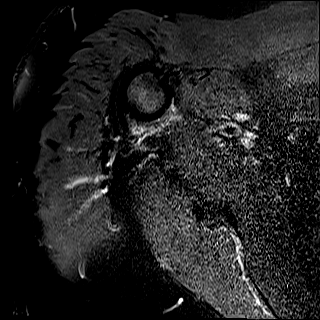
[im 11/26]
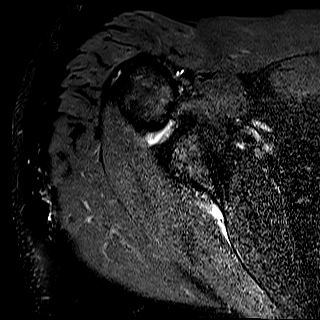
[im 15/26]
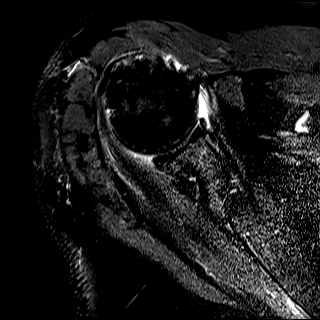
[im 18/26]
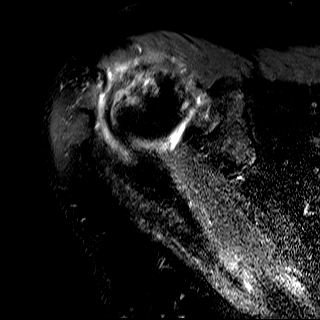
[im 22/26]
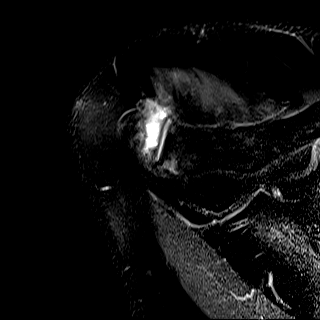
[im 26/26]
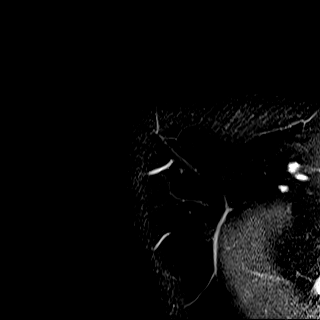

[Series 6: PD · oblique · right · 4.0mm · 0.44mm/px · 9 of 26 slices shown]
[im 1/26]
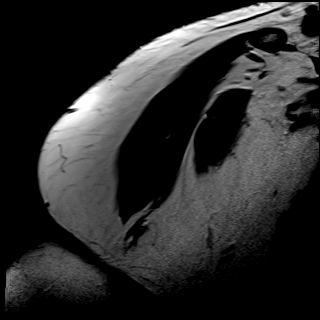
[im 4/26]
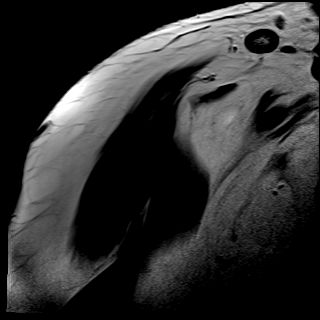
[im 7/26]
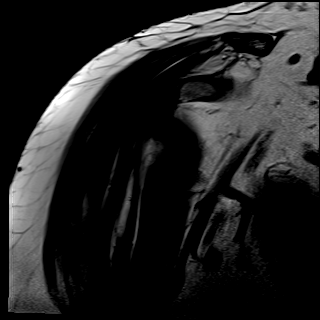
[im 10/26]
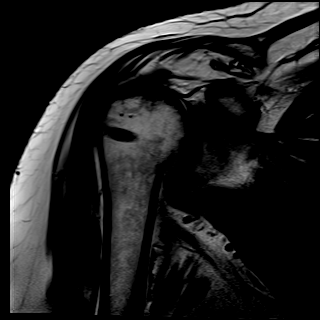
[im 13/26]
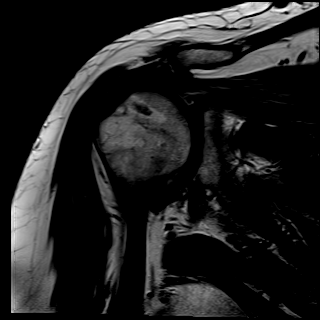
[im 16/26]
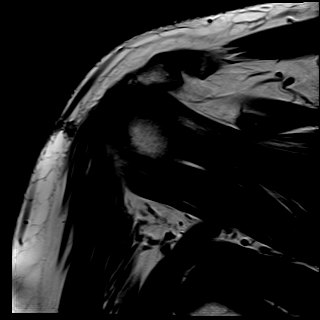
[im 19/26]
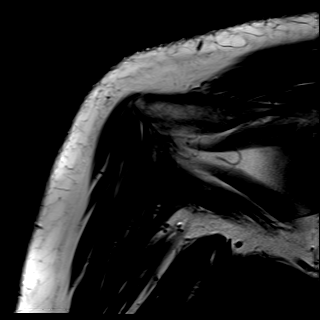
[im 22/26]
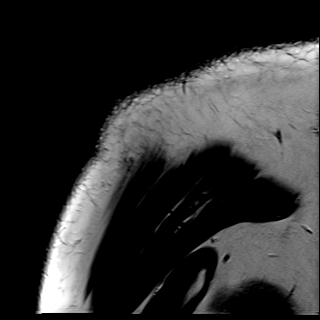
[im 26/26]
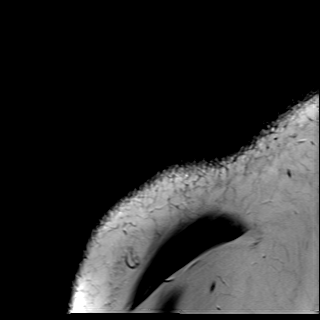

[Series 7: T2 fat-sat · oblique · right · 4.0mm · 0.44mm/px · 9 of 26 slices shown (2 of 3)]
[im 1/26]
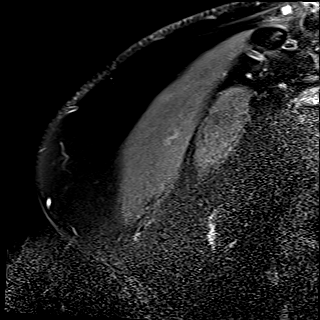
[im 4/26]
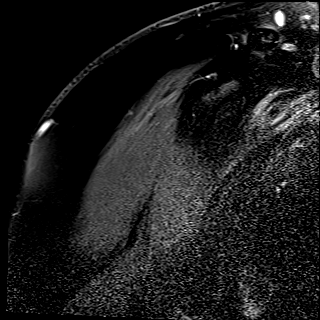
[im 7/26]
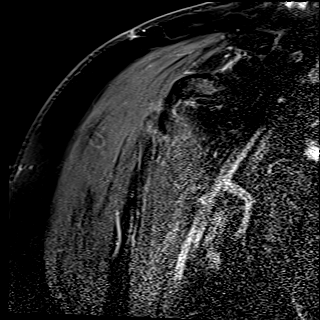
[im 10/26]
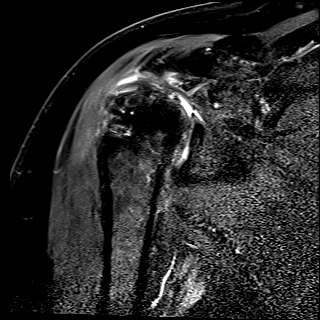
[im 13/26]
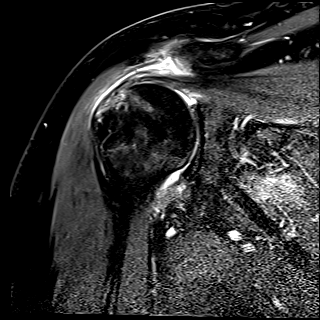
[im 16/26]
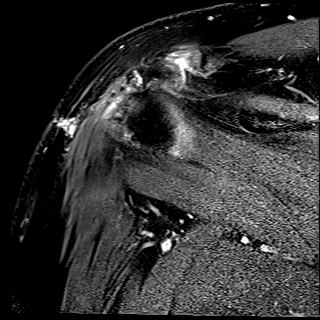
[im 19/26]
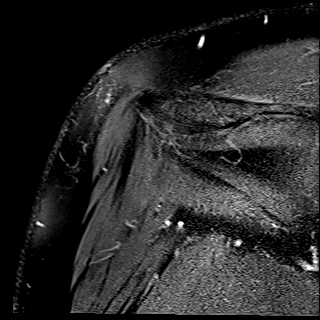
[im 22/26]
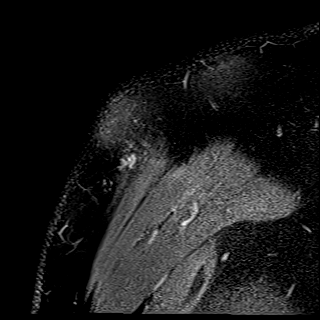
[im 26/26]
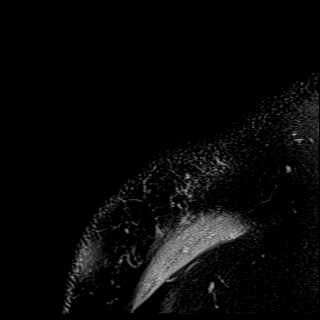

[Series 8: T2 fat-sat · coronal · right · 4.0mm · 0.22mm/px · 4 of 22 slices shown (3 of 3)]
[im 1/22]
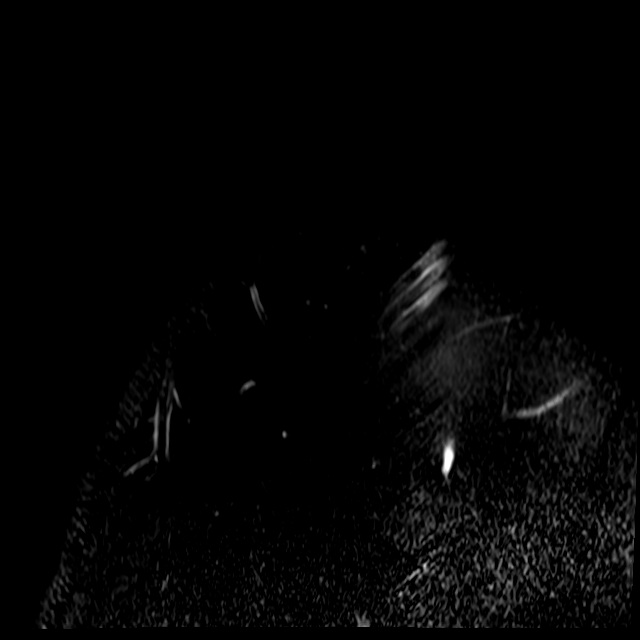
[im 4/22]
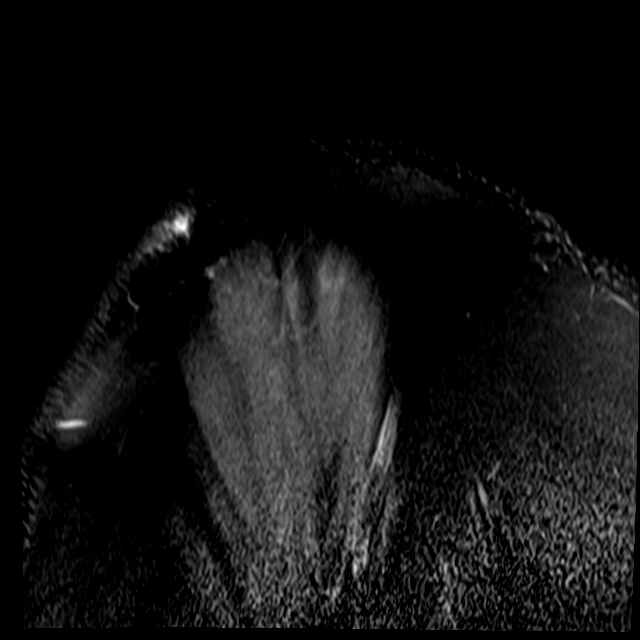
[im 11/22]
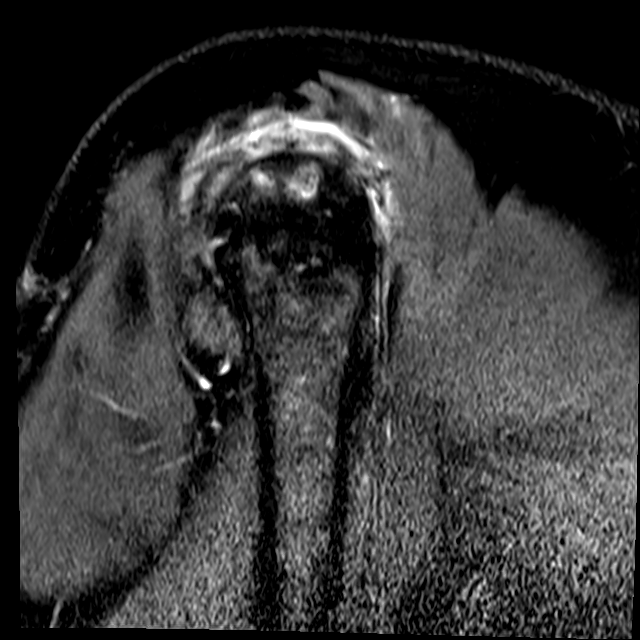
[im 18/22]
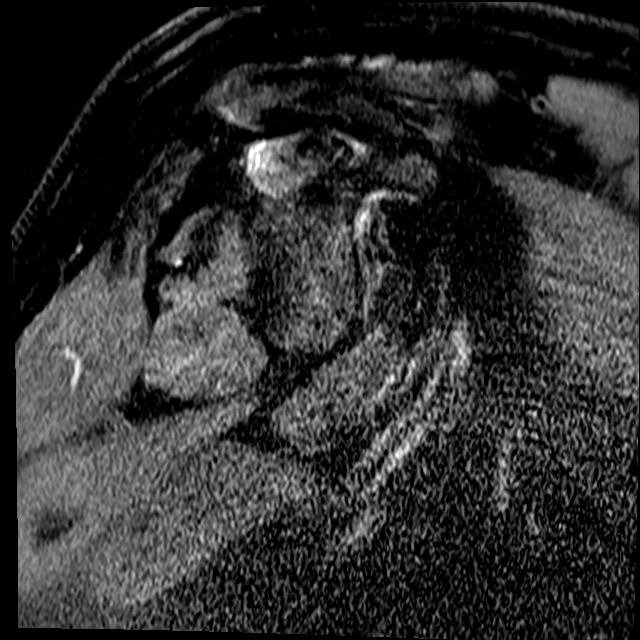

[30 of 40 positions shown; findings below may reference images not displayed]

FINDINGS: Rotator cuff: There are multiple new screw anchors within the
humeral head greater tuberosity status post interval rotator cuff
repair. Previously there was an extensive tear involving the entire
AP dimension of the supraspinatus tendon with both high-grade
partial and anterior full-thickness components. There is mild
expected attenuation of the inserting supraspinatus tendon
footprint, however now no fluid bright tear is seen, and the tendon
appropriately inserts on the humeral head throughout the AP
dimension of the supraspinatus tendon footprint. Mild chronic
supraspinatus diffuse tendinosis. Mild-to-moderate infraspinatus
tendinosis diffusely. One of the surgical screws fixates the
anterior infraspinatus tendon insertion with resolution of a prior
midsubstance partial-thickness tear. There is attenuation of the
articular side of the superior 75% of the subscapularis tendon
insertion, similar to prior. The teres minor is intact.

Muscles: Moderate supraspinatus and mild anterior infraspinatus
muscle atrophy, unchanged from prior. Mild superior subscapularis
muscle atrophy is also unchanged.

Biceps long head: Attenuated tendon fibers are seen of the long head
of the biceps tendon within the bicipital groove similar to prior.
The tendon is not visualized proximal bicipital groove. Previously
there was an at least high-grade partial-thickness tear. There now
appears to be a proximal full-thickness tear versus interval
surgical biceps tenotomy/tenodesis.

Acromioclavicular Joint: Interval distal clavicle excision and
acromioplasty. Type 1 acromion. Mild subacromial/subdeltoid
bursitis.

Glenohumeral Joint: Moderate glenohumeral cartilage degenerative
thinning.

Labrum: Chronic attenuation of the superior glenoid labrum diffusely
similar to prior.

Bones:  No acute fracture.

Other: None.
IMPRESSION: Compared to [DATE]:

1. Interval supraspinatus and anterior infraspinatus rotator cuff
repair. The tendon fibers now appear intact. No significant
supraspinatus or infraspinatus rotator cuff tear is seen. Mild
diffuse supraspinatus and mild-to-moderate infraspinatus tendinosis.
2. Unchanged chronic partial-thickness tearing of the articular side
of the superior 75% of the subscapularis tendon insertion.
3. Moderate supraspinatus and mild anterior infraspinatus and
superior subscapularis muscle atrophy, unchanged.
4. Proximal long head of the biceps tendon full-thickness tear
versus interval surgical biceps tenotomy/tenodesis.
5. Interval distal clavicle excision and acromioplasty.
6. Mild subacromial/subdeltoid bursitis.

## 2022-02-25 ENCOUNTER — Ambulatory Visit: Payer: 59 | Admitting: Podiatry
